# Patient Record
Sex: Female | Born: 1970 | Hispanic: No | Marital: Married | Smoking: Never smoker
Health system: Southern US, Community
[De-identification: ages and names within clinical notes are randomized; demographics above are authoritative.]

## PROBLEM LIST (undated history)

## (undated) ENCOUNTER — Emergency Department (HOSPITAL_COMMUNITY): Discharge status: Absent without leave | Payer: Self-pay

## (undated) DIAGNOSIS — R51 Headache: Secondary | ICD-10-CM

## (undated) HISTORY — PX: OTHER SURGICAL HISTORY: SHX169

---

## 2001-01-08 ENCOUNTER — Other Ambulatory Visit: Admission: RE | Admit: 2001-01-08 | Discharge: 2001-01-08 | Payer: Self-pay | Admitting: Gynecology

## 2001-07-04 ENCOUNTER — Encounter: Admission: RE | Admit: 2001-07-04 | Discharge: 2001-07-04 | Payer: Self-pay | Admitting: Gynecology

## 2001-07-04 ENCOUNTER — Encounter: Payer: Self-pay | Admitting: Gynecology

## 2001-12-31 ENCOUNTER — Other Ambulatory Visit: Admission: RE | Admit: 2001-12-31 | Discharge: 2001-12-31 | Payer: Self-pay | Admitting: Gynecology

## 2002-12-16 ENCOUNTER — Other Ambulatory Visit: Admission: RE | Admit: 2002-12-16 | Discharge: 2002-12-16 | Payer: Self-pay | Admitting: Gynecology

## 2004-01-26 ENCOUNTER — Other Ambulatory Visit: Admission: RE | Admit: 2004-01-26 | Discharge: 2004-01-26 | Payer: Self-pay | Admitting: Gynecology

## 2004-01-28 ENCOUNTER — Encounter: Admission: RE | Admit: 2004-01-28 | Discharge: 2004-01-28 | Payer: Self-pay | Admitting: Gynecology

## 2004-07-06 ENCOUNTER — Ambulatory Visit (HOSPITAL_COMMUNITY): Admission: RE | Admit: 2004-07-06 | Discharge: 2004-07-06 | Payer: Self-pay | Admitting: Urology

## 2004-07-06 ENCOUNTER — Encounter (INDEPENDENT_AMBULATORY_CARE_PROVIDER_SITE_OTHER): Payer: Self-pay | Admitting: *Deleted

## 2005-01-31 ENCOUNTER — Other Ambulatory Visit: Admission: RE | Admit: 2005-01-31 | Discharge: 2005-01-31 | Payer: Self-pay | Admitting: Gynecology

## 2005-02-03 ENCOUNTER — Encounter: Admission: RE | Admit: 2005-02-03 | Discharge: 2005-02-03 | Payer: Self-pay | Admitting: Gynecology

## 2006-09-15 ENCOUNTER — Ambulatory Visit: Payer: Self-pay | Admitting: Internal Medicine

## 2006-10-17 ENCOUNTER — Inpatient Hospital Stay (HOSPITAL_COMMUNITY): Admission: AD | Admit: 2006-10-17 | Discharge: 2006-11-06 | Payer: Self-pay | Admitting: *Deleted

## 2006-10-17 IMAGING — US US OB TRANSVAGINAL MODIFY
1 series · 13 of 28 positions shown · non-contrast
Comparison: none

CLINICAL DATA: 32 week 4 day assigned gestational age by prior ultrasound.  Follow-up placenta previa, growth, and amniotic fluid.

[Series 1: us ob transvaginal modify · 0.25mm/px · 13 of 37 slices shown]
[im 2/37]
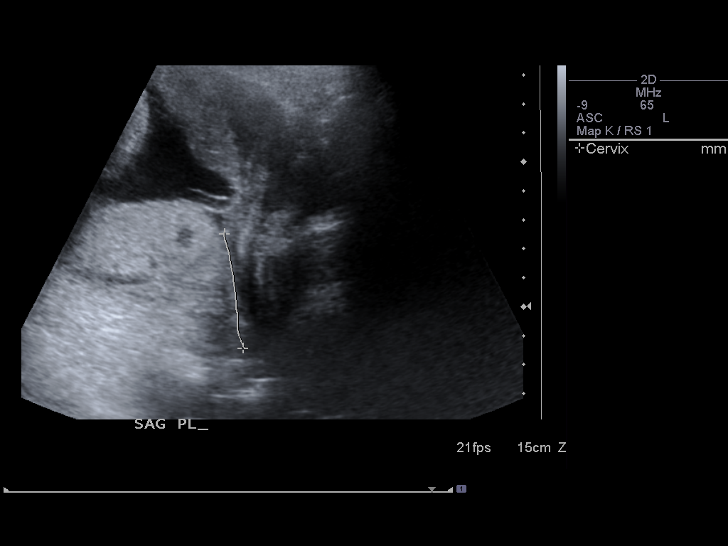
[im 5/37]
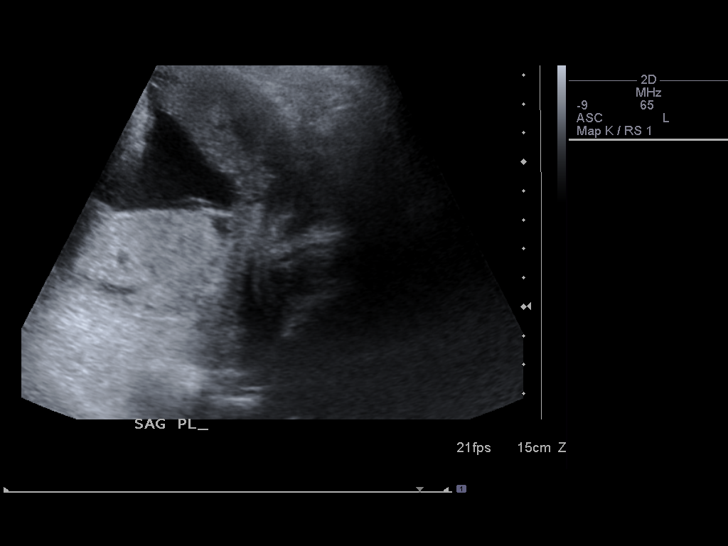
[im 7/37]
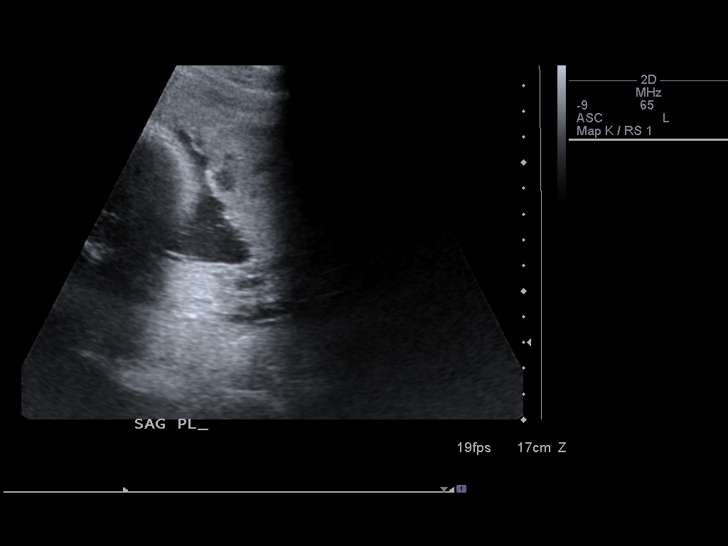
[im 10/37]
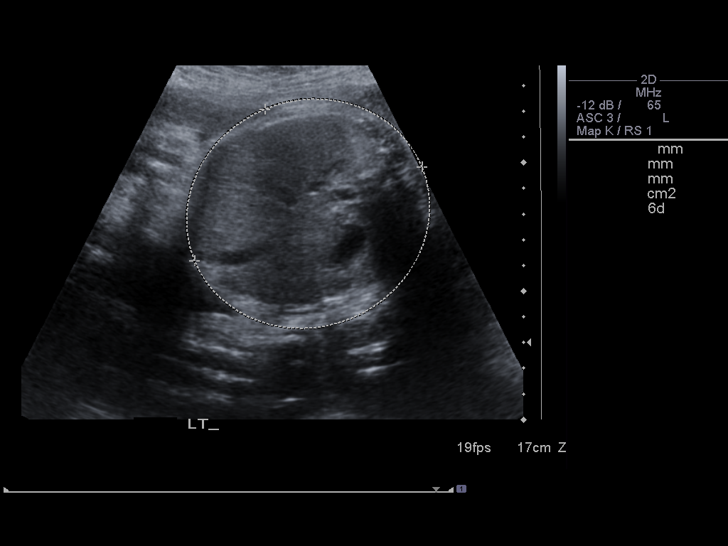
[im 13/37]
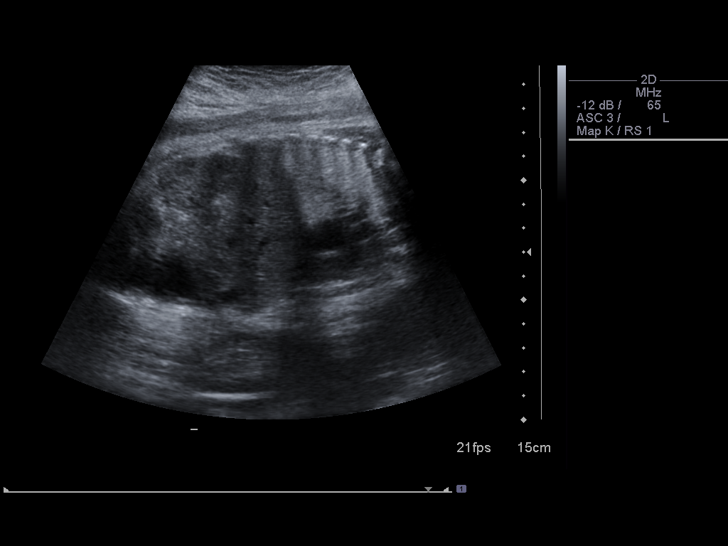
[im 15/37]
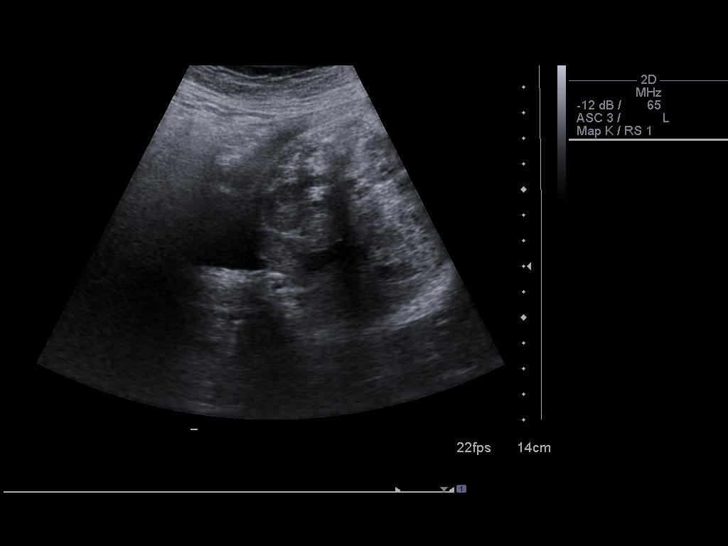
[im 19/37]
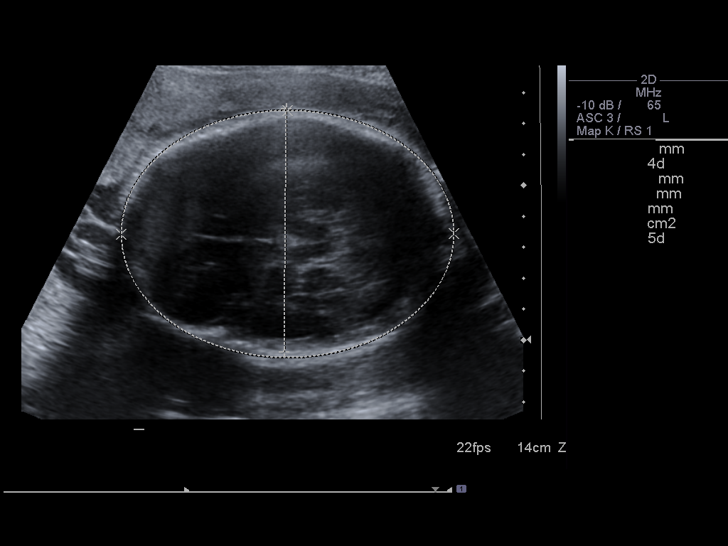
[im 22/37]
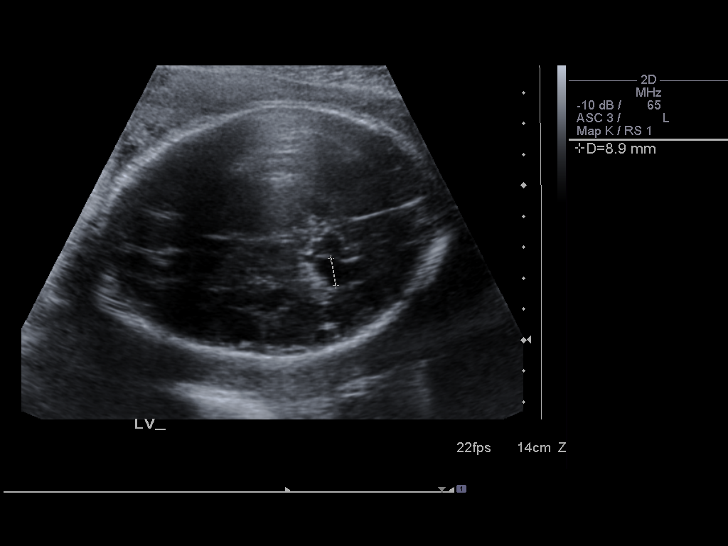
[im 25/37]
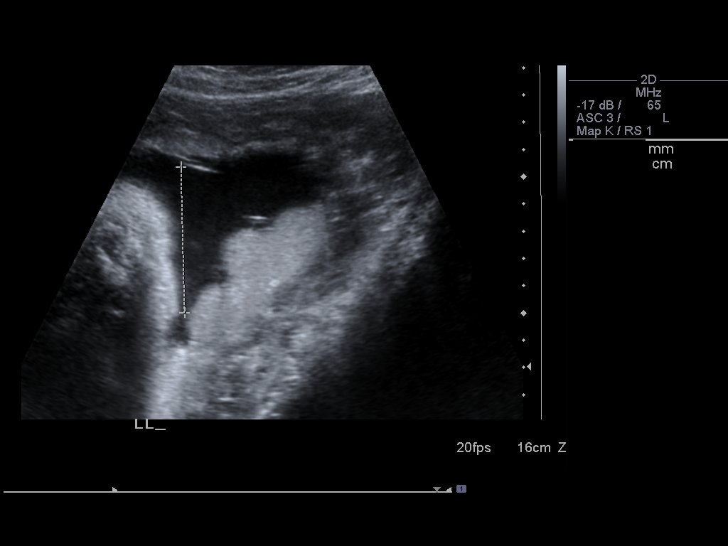
[im 27/37]
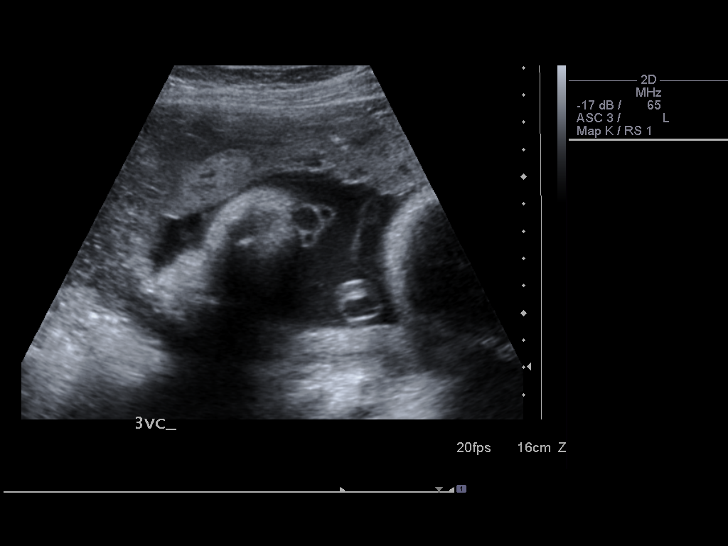
[im 30/37]
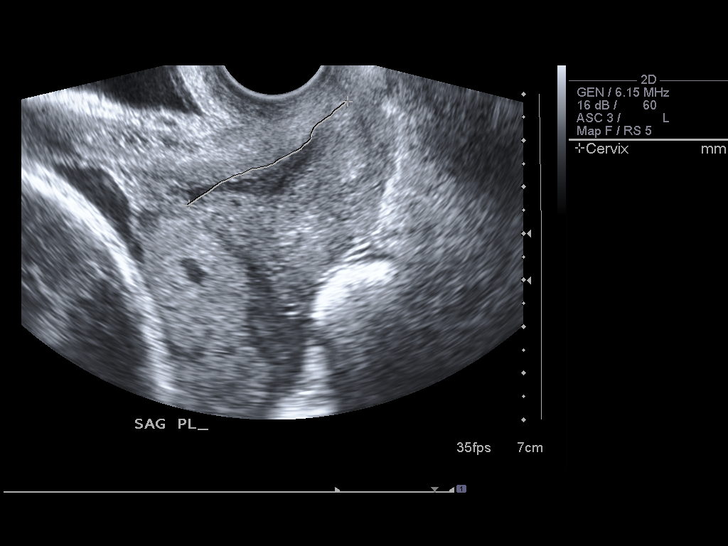
[im 33/37]
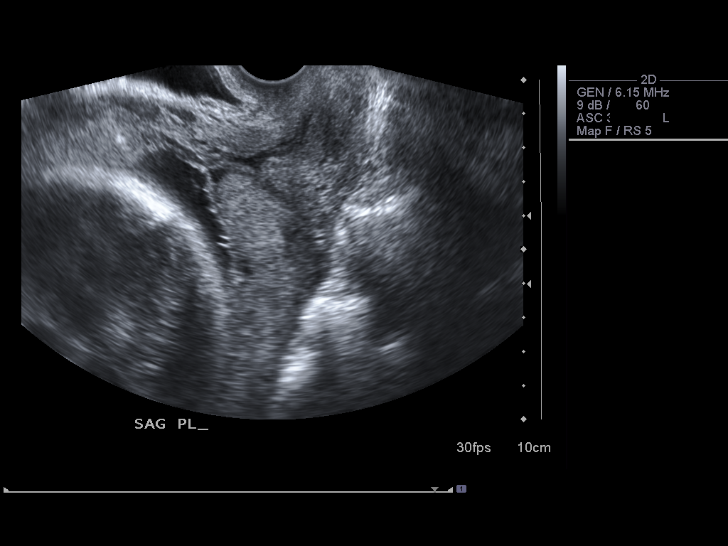
[im 35/37]
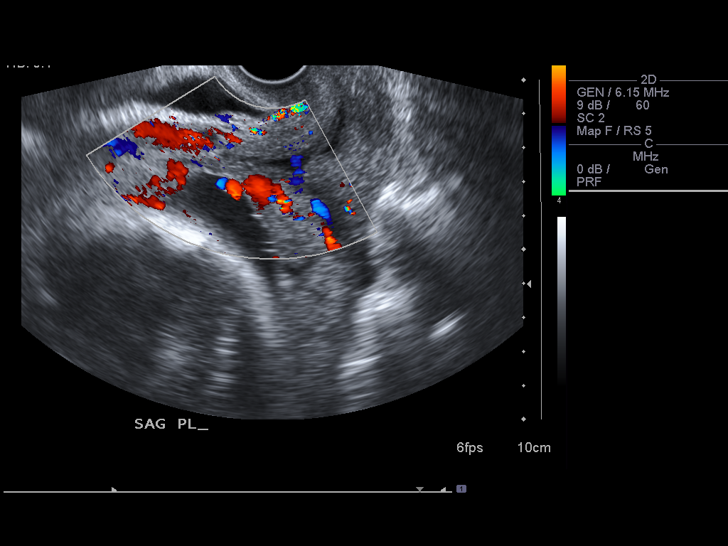

[13 of 28 positions shown; findings below may reference images not displayed]

OBSTETRICAL ULTRASOUND AND TRANSVAGINAL OB US:
 Number of Fetuses:  1
 Heart Rate:  132 bpm
 Movement:  Yes
 Breathing:  Yes
 Presentation:  Cephalic
 Placental Location:  Posterior
 Grade:  II
 Previa:  Asymmetric complete
 Amniotic Fluid (Subjective):  Normal
 Amniotic Fluid (Objective):  AFI 18.0 cm (2th-72th %ile = 8.3 to 24.5 cm for 33 weeks) 

 FETAL BIOMETRY
 BPD:  7.9 cm   31 w 4 d 
 HC:  29.7 cm   32 w 6 d 
 AC:  28.9 cm   33 w 0 d 
 FL:  6.4 cm   32 w 6 d 

 MEAN GA:   32 w 4 d   US EDC:  12/08/06   
 Assigned GA:  32 w 4 d   Assigned EDC:  12/08/06

 EFW:  1322 grams 50th ? 75th %ile (2020 ? 9982 g) for 33 weeks 

 FETAL ANATOMY
 Lateral Ventricles:  Visualized 
 Thalami/CSP:  Visualized 
 Posterior Fossa:  Visualized 
 Nuchal Region:  Not visualized 
 Spine:  Not visualized 
 4 Chamber Heart on Left:  Not visualized 
 Stomach on Left:  Visualized 
 3 Vessel Cord:  Visualized 
 Cord Insertion site:  Not visualized 
 Kidneys:  Visualized 
 Bladder:  Visualized 
 Extremities:  Not visualized 

 Evaluation limited by:  Advanced gestational age.  

 MATERNAL UTERINE AND ADNEXAL FINDINGS
 Cervix:  4.1 cm transvaginal.
IMPRESSION: 1.  Assigned gestational age by prior ultrasound is currently 32 weeks 4 days.  Appropriate fetal growth with EFW slightly greater than 50th percentile.  
 2.  Asymmetric complete placenta previa.  No abruption identified.  Normal cervical length.
 3.  Normal amniotic fluid volume.

## 2006-10-18 ENCOUNTER — Ambulatory Visit: Payer: Self-pay | Admitting: Neonatology

## 2006-11-02 IMAGING — US US AMNIOCENTESIS
1 series · 1 of 1 positions shown · non-contrast
Comparison: none

CLINICAL DATA: 32 weeks with placenta previa.  Evaluate for lung maturity.  
 ULTRASOUND-GUIDED AMNIOCENTESIS:
 Ultrasound was utilized to perform amniocentesis by the requesting physician.

[Series 1: us amniocentesis · 1 of 1 slices shown]
[im 1/1]
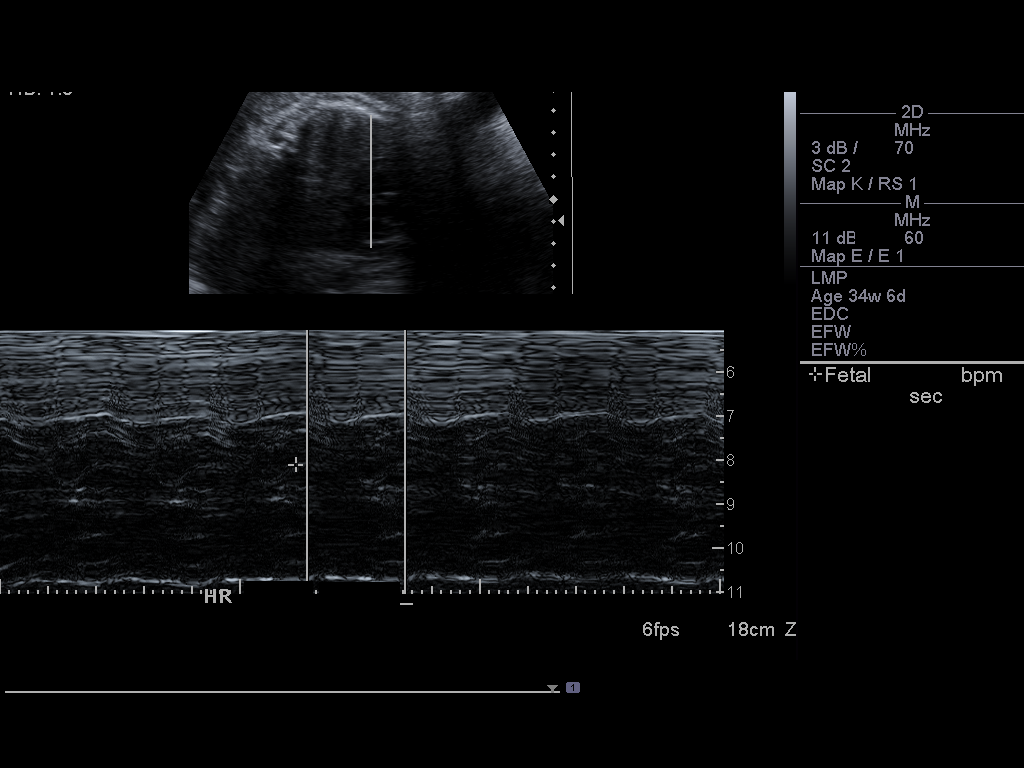

[1 of 1 positions shown; findings below may reference images not displayed]

IMPRESSION: No radiographic interpretation.

## 2006-11-03 ENCOUNTER — Encounter (INDEPENDENT_AMBULATORY_CARE_PROVIDER_SITE_OTHER): Payer: Self-pay | Admitting: *Deleted

## 2008-10-10 HISTORY — PX: LAPAROSCOPIC SUPRACERVICAL HYSTERECTOMY: SUR797

## 2008-11-25 ENCOUNTER — Inpatient Hospital Stay (HOSPITAL_COMMUNITY): Admission: AD | Admit: 2008-11-25 | Discharge: 2008-11-29 | Payer: Self-pay | Admitting: Obstetrics and Gynecology

## 2008-12-04 ENCOUNTER — Inpatient Hospital Stay (HOSPITAL_COMMUNITY): Admission: AD | Admit: 2008-12-04 | Discharge: 2008-12-04 | Payer: Self-pay | Admitting: Obstetrics

## 2008-12-04 ENCOUNTER — Encounter: Payer: Self-pay | Admitting: Obstetrics and Gynecology

## 2008-12-04 IMAGING — US US OB LIMITED
1 series · 14 of 28 positions shown · non-contrast
Comparison: none

OBSTETRICAL ULTRASOUND:
 This ultrasound was performed in The [HOSPITAL], and the [facility] report will be stored to [REDACTED] PACS.

[Series 1: us ob limited · 14 of 44 slices shown]
[im 2/44]
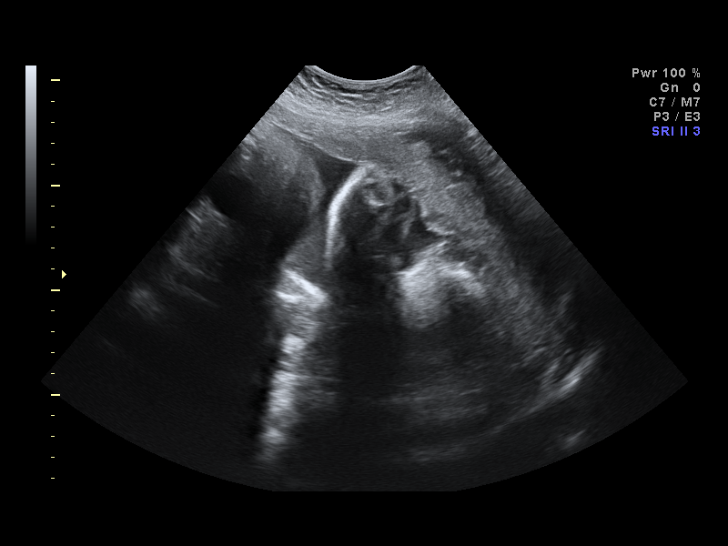
[im 5/44]
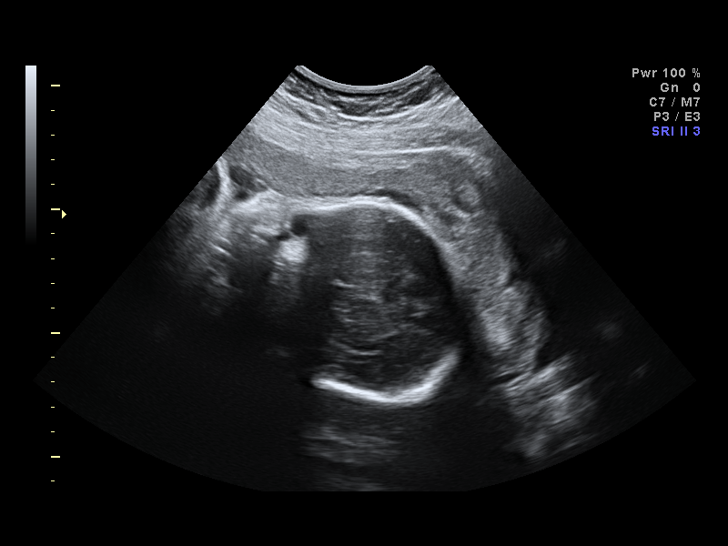
[im 8/44]
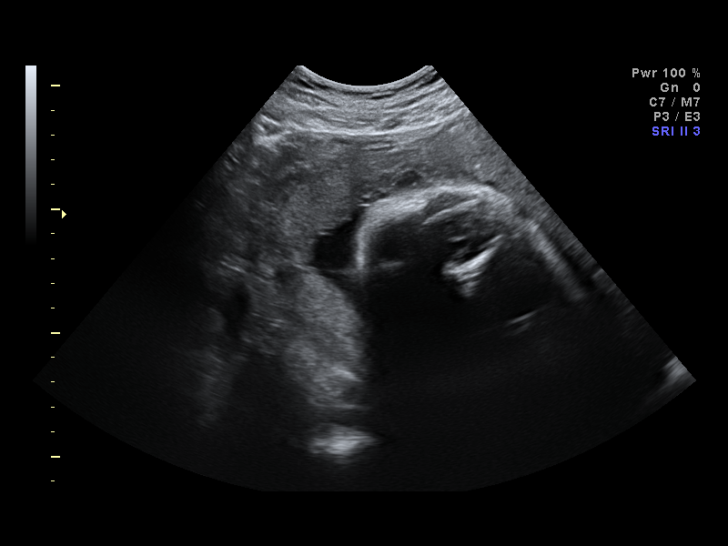
[im 12/44]
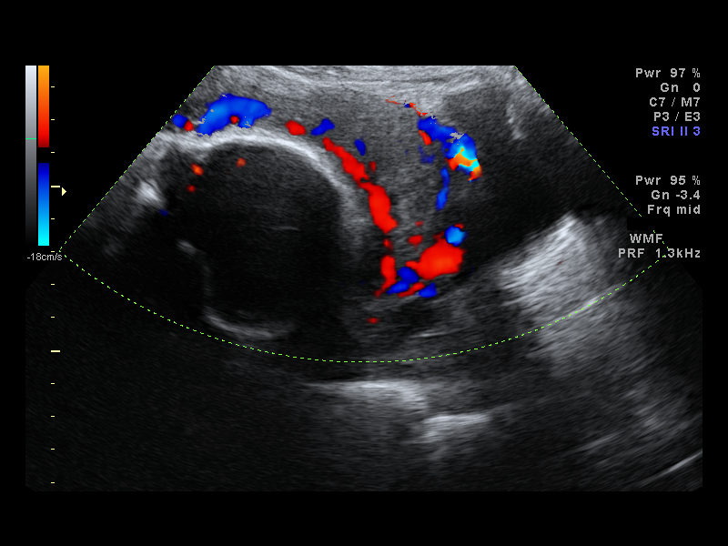
[im 15/44]
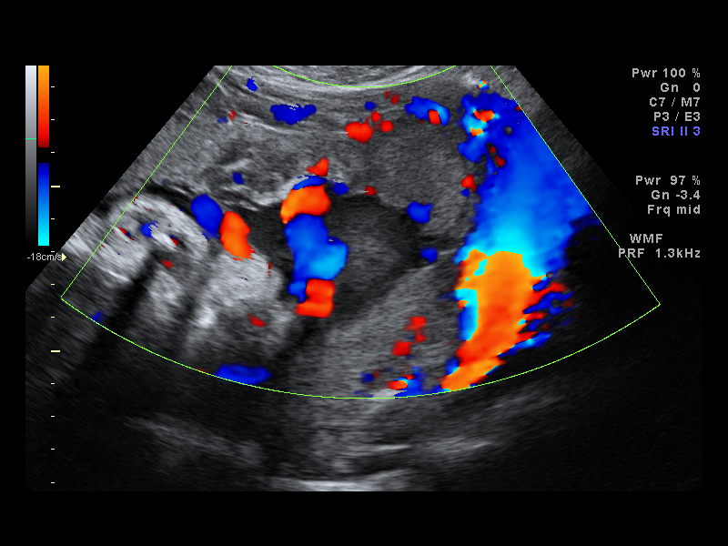
[im 18/44]
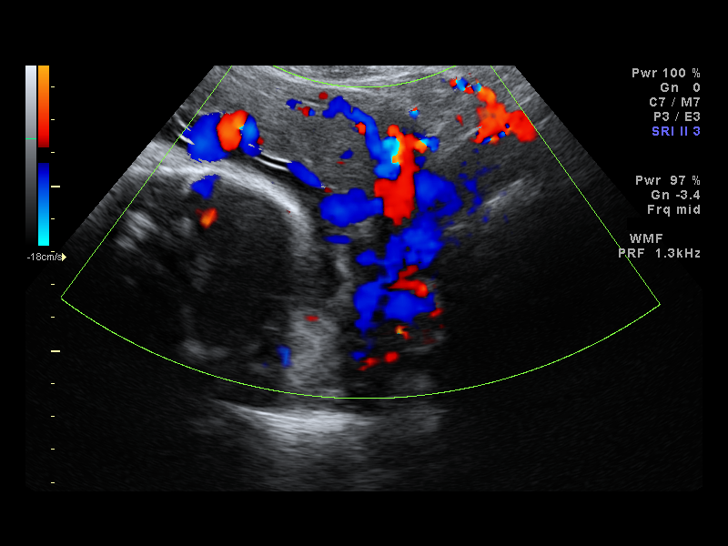
[im 21/44]
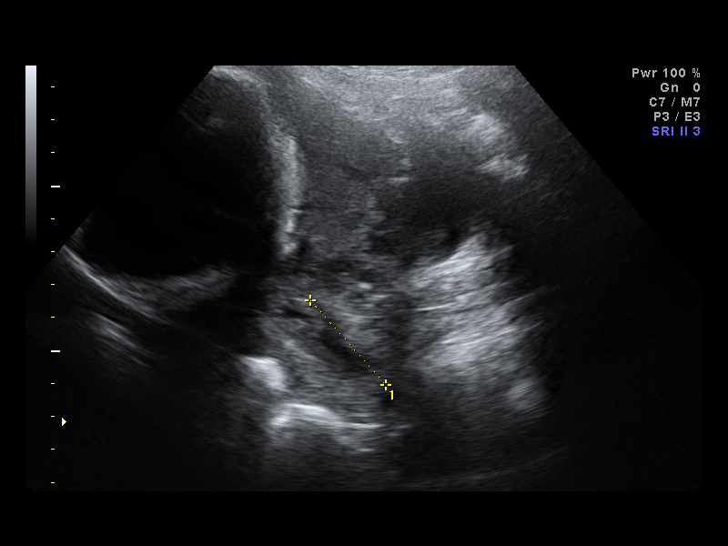
[im 24/44]
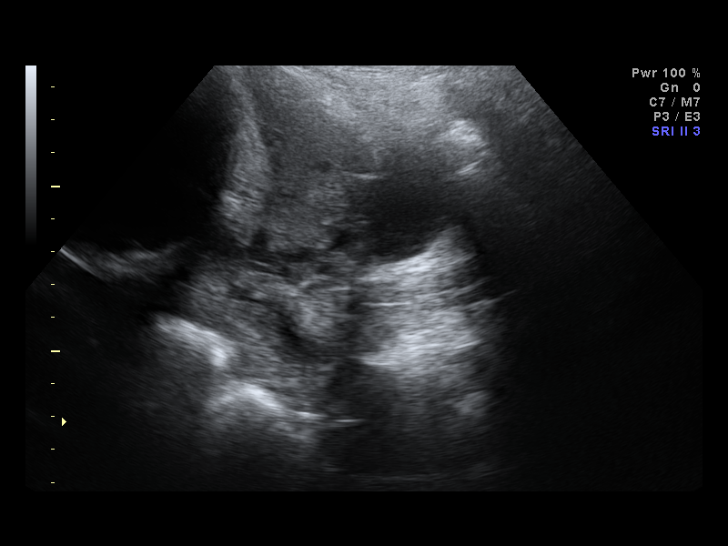
[im 28/44]
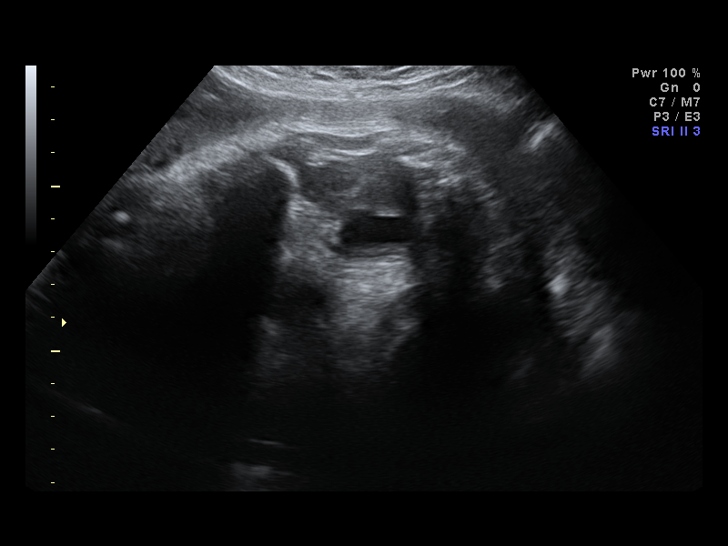
[im 31/44]
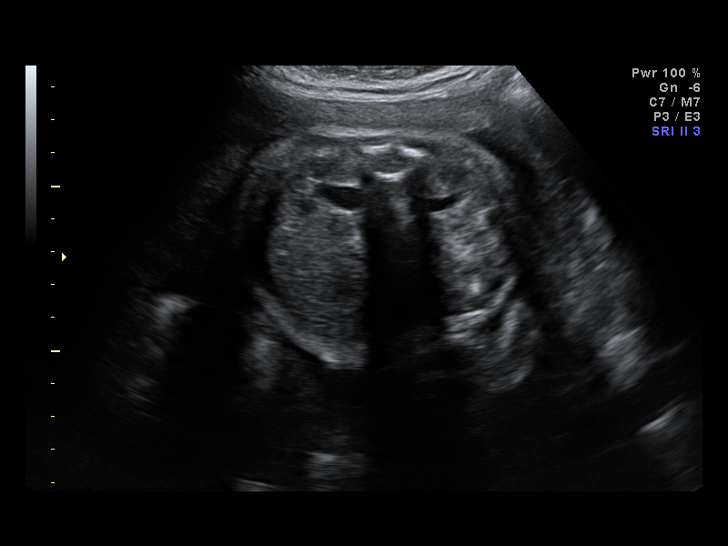
[im 34/44]
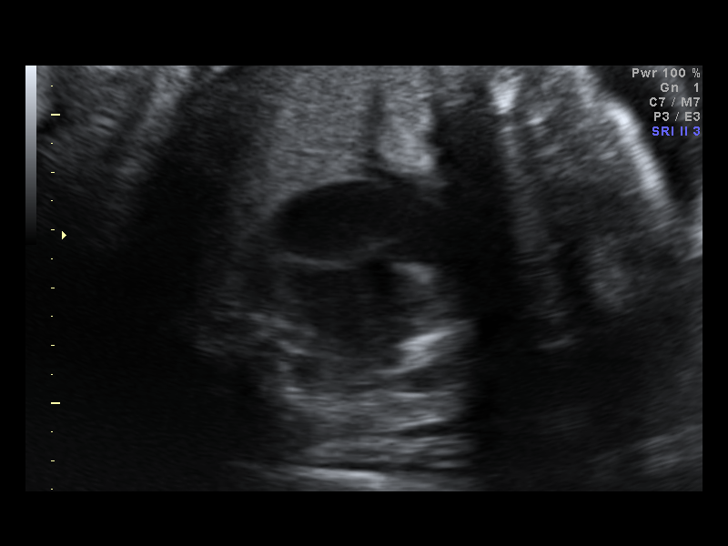
[im 37/44]
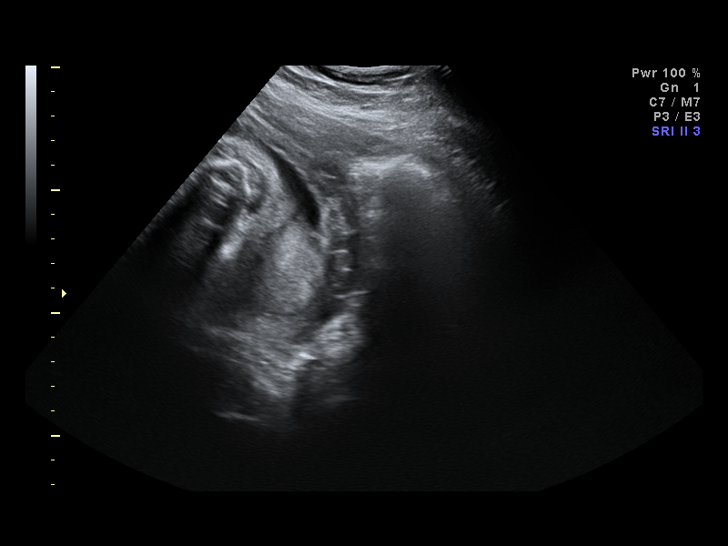
[im 40/44]
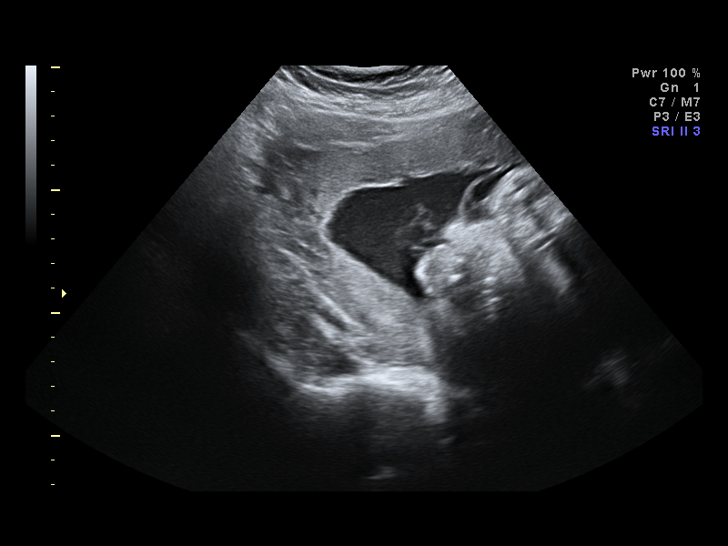
[im 44/44]
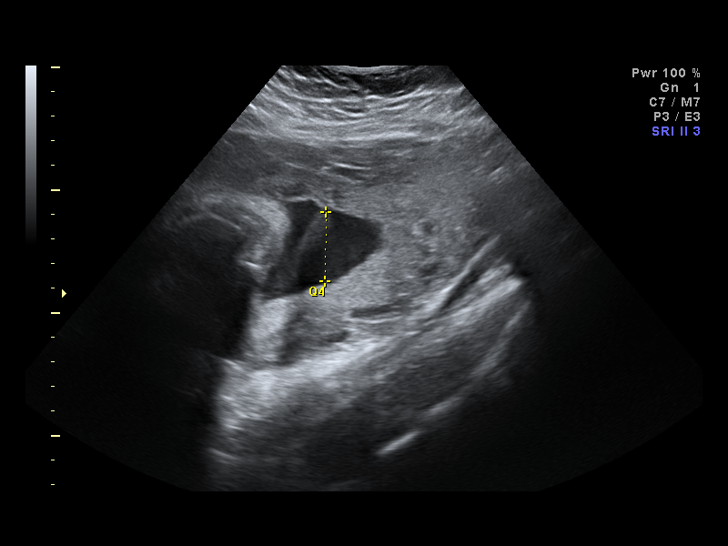

[14 of 28 positions shown; findings below may reference images not displayed]

IMPRESSION: [facility] has also been faxed to the ordering physician.

## 2010-10-31 ENCOUNTER — Encounter: Payer: Self-pay | Admitting: Family Medicine

## 2011-01-25 LAB — TYPE AND SCREEN
ABO/RH(D): O POS
ABO/RH(D): O POS
Antibody Screen: NEGATIVE
Antibody Screen: NEGATIVE

## 2011-01-25 LAB — CBC
HCT: 33.6 % — ABNORMAL LOW (ref 36.0–46.0)
Hemoglobin: 11.4 g/dL — ABNORMAL LOW (ref 12.0–15.0)
MCHC: 33.9 g/dL (ref 30.0–36.0)
Platelets: 282 10*3/uL (ref 150–400)
RBC: 3.7 MIL/uL — ABNORMAL LOW (ref 3.87–5.11)
RBC: 3.79 MIL/uL — ABNORMAL LOW (ref 3.87–5.11)
RDW: 14.1 % (ref 11.5–15.5)
WBC: 9.2 10*3/uL (ref 4.0–10.5)

## 2011-01-25 LAB — SAMPLE TO BLOOD BANK

## 2011-02-04 ENCOUNTER — Other Ambulatory Visit: Payer: Self-pay | Admitting: Obstetrics and Gynecology

## 2011-02-04 DIAGNOSIS — Z1231 Encounter for screening mammogram for malignant neoplasm of breast: Secondary | ICD-10-CM

## 2011-02-09 ENCOUNTER — Ambulatory Visit
Admission: RE | Admit: 2011-02-09 | Discharge: 2011-02-09 | Disposition: A | Discharge status: Home / Self Care | Payer: Federal, State, Local not specified - PPO | Source: Ambulatory Visit | Attending: Obstetrics and Gynecology | Admitting: Obstetrics and Gynecology

## 2011-02-09 DIAGNOSIS — Z1231 Encounter for screening mammogram for malignant neoplasm of breast: Secondary | ICD-10-CM

## 2011-02-22 NOTE — H&P (Signed)
NAMECHERRILL, Stacey            ACCOUNT NO.:  [id number]   MEDICAL RECORD NO.:  [id number]          PATIENT TYPE:  INP   LOCATION:  9152                          FACILITY:  [facility]   PHYSICIAN:  Lenoard Aden, M.D.DATE OF BIRTH:  [birth year]   DATE OF ADMISSION:  11/25/2008  DATE OF DISCHARGE:                              HISTORY & PHYSICAL   CHIEF COMPLAINT:  Placenta previa with bleeding.   She is a 40 year old female G6, P1 at 62 and 6/7th weeks' gestation who  presented to the office with 1 episode of spotting today.  After  discharge, she had an episode of heavy bleeding tonight which saturated  1 pad.  She presents to the emergency room for further evaluation.  Upon  presentation, minimal active bleeding is noted.   She has no known drug allergies.   MEDICATIONS:  Valtrex as needed, Nasonex, albuterol, Intal, and prenatal  vitamins.   FAMILY HISTORY:  She has a family history of kidney stones, diabetes,  brain cancer, and osteoporosis.   She has a previous history of 3 therapeutic abortions and 1 spontaneous  abortion and a previous C-section, planned for placenta previa with  bleeding.   SOCIAL HISTORY:  Noncontributory.   PHYSICAL EXAMINATION:  GENERAL:  She is a well-developed, well-nourished  white female in no acute distress.  HEENT:  Normal.  NECK:  Supple.  Full range of motion.  LUNGS:  Clear.  HEART:  Regular rhythm.  ABDOMEN:  Soft, gravid, and nontender.  Lower abdominal incision noted.  Speculum exam reveals no active bleeding  EXTREMITIES:  There are no cords.  NEUROLOGIC:  Nonfocal.  SKIN:  Intact.   NST is reactive.  No contractions are noted.   IMPRESSION:  1. A 28 and 6/7th week intrauterine pregnancy.  2. Complete placenta previa with subsequent episode of bleeding for      inpatient management.   PLAN:  IV fluids, betamethasone, NICU consult.  Continuous monitoring  clear liquid at this time.  Anticipate inpatient hospitalization at  this  time.      Lenoard Aden, M.D.  Electronically Signed     RJT/MEDQ  D:  11/25/2008  T:  11/26/2008  Job:  [id number]

## 2011-02-22 NOTE — H&P (Signed)
Stacey Esparza, Stacey Esparza            ACCOUNT NO.:  [id number]   MEDICAL RECORD NO.:  [id number]          PATIENT TYPE:  OUT   LOCATION:  MFM                           [facility]   PHYSICIAN:  Lenoard Aden, M.D.DATE OF BIRTH:  Oct 27, 1970   DATE OF ADMISSION:  12/04/2008  DATE OF DISCHARGE:                              HISTORY & PHYSICAL   TRANSFER SUMMARY   CHIEF COMPLAINT:  Bleeding.   HISTORY OF PRESENT ILLNESS:  The patient is a 40 year old female G6, P0-  1-4-1 with a history of previous C-section at [redacted] weeks gestation for a  complete previa in 2008 and now subsequently pregnant with recurrent  placenta previa.  She most recently on Monday, February 22 had an MRI  performed at [facility] which reveals a suspicion for a left lateral  accreta and possible percreta in conjunction with her placenta previa.  Consultation was done with Dr. Roney Mans at that time who agrees  that the patient will need to be transferred for delivery for  institutional and surgical support to [facility].  The patient has had  steroids as of 1 week ago and betamethasone is complete.  The rest of  her prenatal care has been uncomplicated to date.   ALLERGIES:  The patient has no known drug allergies.   PAST MEDICAL HISTORY:  She has a past medical history remarkable for  asthma, urolithiasis, fibroids, HSV and asthma.   SOCIAL HISTORY:  She is a nonsmoker, nondrinker.  She denies domestic or  physical violence.   PREGNANCY HISTORY:  She has a pregnancy history remarkable for an  uncomplicated C-section at 35 weeks for a complete previa, history of  TAB x3 and D and C x1 previously.   MEDICATIONS:  To include Nasonex as needed, Intal inhaler as needed,  prenatal vitamins and Valtrex as needed.   PHYSICAL EXAM:  GENERAL:  She is a well-developed, well-nourished female  in no acute distress.  VITAL SIGNS:  Stable.  The patient is afebrile.  HEENT:  Normal.  LUNGS:  Clear.  HEART:   Regular rhythm.  NECK:  Supple.  Full range of motion.  ABDOMEN:  Soft, gravid, nontender.  Estimated fetal weight has been in  the normal range per previous serial sonography.  There is no CVA  tenderness.  EXTREMITIES:  There are no cords.  NEUROLOGICAL:  Nonfocal.  SKIN:  Intact.  PELVIC:  Exam deferred.  Pad visualization reveals minimal spotting and  no heavy bleeding during this most recent hospitalization.   CBC today reveals a white blood cell count of 9.4, hemoglobin 11.4,  hematocrit of 33.6 and platelet count 285,000.   IMPRESSION:  1. A 30 week OB.  2. Previous C-section.  3. Complete previa with MRI suspicious for accreta/percreta as      discussed with the radiologist.   PLAN:  Will be continuous monitoring at this time, repeat consultation  will be obtained this morning with maternal fetal medicine, Dr. Rachel Bo.  Will recommend at that time due to the patient's stable status and  recurrent spotting that she be transferred at this time to  [facility]  for institutional and surgical support with regards to her delivery.  The patient does not live within 20 minutes of the [facility] facility  therefore inpatient hospitalization will most likely be required.  Discussion has been made with the patient for the need for  interventional radiology with probable and likely cesarean hysterectomy  and midline incision.  She acknowledges these risks and is comfortable  with proceeding.      Lenoard Aden, M.D.  Electronically Signed     RJT/MEDQ  D:  12/04/2008  T:  12/04/2008  Job:  [id number]

## 2011-02-25 NOTE — Op Note (Signed)
Stacey Esparza, Stacey Esparza            ACCOUNT NO.:  [id number]   MEDICAL RECORD NO.:  [id number]          PATIENT TYPE:  INP   LOCATION:  9154                          [facility]ILITY:  [facility]   PHYSICIAN:  Lenoard Aden, M.D.DATE OF BIRTH:  [birth year]   DATE OF PROCEDURE:  10/24/2006  DATE OF DISCHARGE:                               OPERATIVE REPORT   PREOPERATIVE DIAGNOSIS:  Thrombosed hemorrhoid.   POSTOPERATIVE DIAGNOSIS:  Thrombosed hemorrhoid.   PROCEDURE:  Incision and drainage of thrombosed hemorrhoid.   SURGEON:  Lenoard Aden, M.D.   ANESTHESIA:  Local.   ESTIMATED BLOOD LOSS:  Less than 10 mL.   COMPLICATIONS:  None.   DRAINS:  None.   DISPOSITION:  Patient to Recovery in good condition.   DESCRIPTION:  After being apprised of risks and benefits of infection  and bleeding and consent is signed, a 2- to 3-cm anterior thrombosed  hemorrhoid is noted.  This area was cleansed with a Betadine solution  and topically anesthetized using Hurricaine spray.  A local solution on  a dilute Xylocaine and epinephrine solution is placed at the base of the  hemorrhoid and topically over the area of thrombosis.  A #11 scalpel is  then used to make a small linear incision in the hemorrhoid and a large  amount of blood clot is drained.  Hemostasis is achieved using silver  nitrate; good hemostasis is noted.  The patient tolerates the procedure  well and is recovering in good condition.      Lenoard Aden, M.D.  Electronically Signed     RJT/MEDQ  D:  10/24/2006  T:  10/24/2006  Job:  [id number]   cc:   [facility]

## 2011-02-25 NOTE — Discharge Summary (Signed)
Stacey Esparza, Stacey Esparza            ACCOUNT NO.:  [id number]   MEDICAL RECORD NO.:  [id number]          PATIENT TYPE:  INP   LOCATION:  9152                          [facility]   PHYSICIAN:  Lenoard Aden, M.D.DATE OF BIRTH:  [birth year]   DATE OF ADMISSION:  11/25/2008  DATE OF DISCHARGE:  11/29/2008                               DISCHARGE SUMMARY   The patient was admitted on November 25, 2008, 28 and 6/7th weeks.  She  was admitted with bleeding and hospital course was uncomplicated.  She  remained stable.  She did not believe during the course of her hospital  management, fetal heart rate surveillance was reassuring with reactive  NSTs performed twice daily.  Maternal fetal consultation was done.  MRI  was scheduled.  She is discharged to home on hospital day #4.  Bleeding  precautions were given.  Pelvic rest.  Medications include prenatal  vitamins and iron.  Follow up in the office within 1 week.      Lenoard Aden, M.D.  Electronically Signed     RJT/MEDQ  D:  02/12/2009  T:  02/13/2009  Job:  [id number]

## 2011-02-25 NOTE — Assessment & Plan Note (Signed)
**Note De-Identified Stacey Obfuscation** [facility]                             PULMONARY OFFICE NOTE   NAME:Stacey Esparza, Stacey Esparza                   MRN:          [id number]  DATE:09/15/2006                            DOB:          [birth year]    PROBLEM:  Pulmonary consultation at the kind request of Dr. Earlene Plater, at  [facility], for this 40 year old woman in her first pregnancy  concerned about asthma.   HISTORY:  Asthma was first diagnosed 20 years ago. Usual triggers have  included exposure to animals and house dust. An albuterol inhaler sample  has been helping, but she has been trying to be sparing with that, using  it only once-Esparza-day, unsure about guidelines. Her pregnancy is  uncomplicated. Usual season peak for her respiratory complaints is  winter. Currently, she feels stable. Before pregnancy, one inhaler was  lasting all year. Weather changes seem to make some difference. Spring  and fall pollen exposures are less clearly aggravating and there is no  history of trouble with aspirin. Persistent cough and shortness of  breath are beginning to wake her.   MEDICATIONS:  Zyrtec, pre-natal vitamins, Pro-Air albuterol inhaler.   No medication allergy. No problems with aspirin, contrast, iodine  reactions or latex.   REVIEW OF SYSTEMS:  Shortness of breath mainly with activity, non-  productive cough, some chest tightness and wheeze, and occasional post-  nasal drainage. She denies reflux symptoms, chest pain, palpitation,  rash or ankle edema. There has been nothing bloody or purulent.   PAST HISTORY:  Seasonal rhinitis, sinusitis, no otitis, and occasional  bronchitis. Surgery limited to Esparza breast cyst in 2005. No ENT surgery.   SOCIAL HISTORY:  Never smoked, married, with no children yet. She works  as Esparza Education officer, environmental for [employer].  She and her husband live in Esparza house with central air conditioning, no  basement, no recognized mold problems,  and 2 dogs. She does not think  any of her bedding is feather, but they do not have encasings or air  cleaners.   FAMILY HISTORY:  Esparza sister has asthma. Esparza sister has obstructive sleep  apnea with CPAP. Grandmother has cancer.   OBJECTIVE:  Weight 178 pounds, blood pressure 108/64, pulse regular 105,  and room air saturation 96%.  She looks quite comfortable now.  SKIN: Old acne, no rash.  ADENOPATHY: None found.  HEENT: Palate spacing is 3/4, there is mucoid rhinitis with turbinate  edema, no visible polyps, pharynx is not red, there is no strider, voice  quality is normal, no neck vein distension or thyromegaly.  CHEST: Dry cough, trace wheeze, unlabored, no dullness.  HEART: Regular rhythm, no murmur or gallop.  EXTREMITIES: No cyanosis, clubbing, or edema.   IMPRESSION:  1. Chronic asthma with viral and environmental and allergic triggers      most likely. Currently, beginning to have more trouble, partly      because of mechanical bulk effects of her pregnancy. I have      educated her about watching for reflux as Esparza potential trigger, but      she does not  think she is having that yet.  2. Near the end of the exam, she does mention that snoring is an      issue, and we discussed the symptoms that would suggest sleep      apnea. This can be addressed after she delivers if it persists. She      will try to sleep off the flat of her back, and we will treat her      rhinitis symptoms to see if that helps as well.   PLAN:  1. Add Intal 2 puffs q.i.d. on Esparza maintenance basis as an anti-      inflammatory.  2. Prescription to albuterol HFA rescue inhaler 2 puffs q.i.d. p.r.n.  3. Sample Nasonex one or two sprays each nostril daily.  4. We discussed asthma in pregnancy, including the basic understanding      that she must breathe for her baby, and that may need to take      precedence over any concerns about choice of medication, but we      will try to minimize use of medications  with Esparza potential fetal      involvement where we can. She was comfortable with this approach.      Schedule to return in 3 weeks or earlier p.r.n.     Clinton D. Maple Hudson, MD, Tonny Bollman, FACP  Electronically Signed    CDY/MedQ  DD: 09/16/2006  DT: 09/17/2006  Job #: [id number]   cc:   Gerri Spore B. Earlene Plater, M.D.

## 2011-02-25 NOTE — Discharge Summary (Signed)
Stacey Esparza, Stacey Esparza            ACCOUNT NO.:  [id number]   MEDICAL RECORD NO.:  [id number]          PATIENT TYPE:  INP   LOCATION:  9131                          [facility]   PHYSICIAN:  [place] B. Earlene Plater, M.D.  DATE OF BIRTH:  June 22, 1971   DATE OF ADMISSION:  10/17/2006  DATE OF DISCHARGE:  11/06/2006                               DISCHARGE SUMMARY   ADMISSION DIAGNOSES:  1. 32-week intrauterine pregnancy.  2. Placenta previa.   DISCHARGE DIAGNOSES:  1. 32-week intrauterine pregnancy.  2. Placenta previa.   HISTORY OF PRESENT ILLNESS:  This is a 40 year old African American  female gravida 5, para 0, A-4, who was admitted at 32-4/7 weeks with  known placenta previa with one episode of bleeding back at 29 weeks.  The patient had traveled for the holidays, had an episode of bleeding  and was temporarily hospitalized in [place] as a result.  When the  patient was deemed stable she was discharged to home and returned to the  office for follow up.  It was my recommendation at that time to go ahead  and be readmitted given her placenta previa and history of bleeding.  However, the patient preferred to stay at home which she did for about  two weeks.  At that point, she agreed for inpatient admission.   HOSPITAL COURSE:  The patient was admitted to the antepartum service  ultimately at 32-4/7 weeks, placed on bed rest with intermediate fetal  monitoring.  This was always reassuring.  Ultrasound showed appropriate  interval growth and normal fluid.  The patient was administered Beta  Methasone during her initially bleeding episode at 29 to 30 weeks.  On  admission, consultation with maternal fetal medicine was obtained and  the recommendation was for continued inpatient with amniocentesis at 35  weeks and if lungs were documented to be mature move ahead with  delivery.   The patient subsequently underwent amniocentesis at 34-5/7 weeks and was  found to have mature lung  studies.  Subsequently, the patient was  delivered by a primary low transverse cesarean section.  Findings at the  time of surgery included a viable female, Apgar's 6 and 8, weight 5  pounds 6 ounces, complete placenta previa and a posterior fundal  subserosal fibroid which was about 3 cm in diameter.  Tubes and ovaries  appeared normal.  Blood loss estimation was 1,600 mL and there was no  evidence for placenta accreta.   Postoperatively, the patient rapidly regained her ability to ambulate,  void and tolerate a regular diet.  She was mildly anemic with a  postoperative hemoglobin of 8.  She was started on iron supplements as a  result.  The patient was discharged to home on the third postoperative  day in satisfactory condition.  Discharge instructions per booklet.   DISCHARGE MEDICATIONS:  1. Ferrous sulfate 325 mg p.o. b.i.d.  2. Percocet 1-2 q. 4 h p.r.n. pain.   FOLLOWUP:  With OB/GYN in 6 weeks.   CONDITION ON DISCHARGE:  Satisfactory.      Gerri Spore B. Earlene Plater, M.D.  Electronically Signed  WBD/MEDQ  D:  11/22/2006  T:  11/22/2006  Job:  [id number]

## 2011-02-25 NOTE — Op Note (Signed)
NAMESHALAN, Stacey Esparza            ACCOUNT NO.:  [id number]   MEDICAL RECORD NO.:  [id number]          PATIENT TYPE:  INP   LOCATION:  9154                          [facility]   PHYSICIAN:  [place] B. Earlene Plater, M.D.  DATE OF BIRTH:  1971/01/15   DATE OF PROCEDURE:  11/03/2006  DATE OF DISCHARGE:                               OPERATIVE REPORT   PREOPERATIVE DIAGNOSES:  1. A 35-week intrauterine pregnancy.  2. Complete placenta previa.   POSTOPERATIVE DIAGNOSES:  1. A 35-week intrauterine pregnancy.  2. Complete placenta previa.   PROCEDURE:  Primary low transverse Cesarean section with T extension.   SURGEON:  Chester Holstein. Earlene Plater, M.D.   ASSISTANT:  Richardean Sale, M.D.   ANESTHESIA:  Spinal.   SPECIMENS:  Placenta to pathology.   BLOOD LOSS:  1600.   COMPLICATIONS:  None.   FINDINGS:  Viable female, Apgars 6 and 8, weight 5 pounds 6 ounces, pH  7.19. Complete placenta previa.  Posterior fundal subserosal fibroid.  Normal appearing tubes and ovaries.   INDICATIONS:  The patient with the above history with mature lung  studies by amniocentesis performed yesterday. The patient presents for  delivery. Risks of surgery discussed including infection, bleeding,  damage to surrounding organs and potential need for life-saving  hysterectomy. The patient's hemoglobin coming in was in the 11 range.   PROCEDURE:  The patient was taken to the operating room. Spinal  anesthesia was obtained. She was prepped and draped in standard fashion.  Foley catheter inserted into the bladder.   Pfannenstiel incision made and carried sharply to the fascia. The fascia  was divided sharply, and the underlying rectus muscles were dissected  off sharply. Posterior sheath and the peritoneum elevated and entered  sharply. Bladder blade inserted. Bladder flap created sharply.   Uterine incision made in a low-transverse fashion with a knife in the  upper portion of the lower uterine segment. On entry  to the uterine  cavity, the tip of the placenta was encountered. However, the bulk of  the previa was below the incision. Amniotomy performed. Clear fluid  noted. Vigorous bleeding encountered with the incision.   The vertex was directed to the incision. However, the vertex was  floating and even with fundal pressure would not come through the  incision. Therefore, Kiwi vacuum was attempted, placed on midline from  the mid green zone, one pull, one pop off, and therefore switched to the  Mityvac mushroom cup. One additional pull which did pop off, however,  did facilitate delivery of the vertex. The uterine incision was extended  in a T fashion on its superior edge to facilitate delivery of the  vertex. The extension length was about 2 cm. Nose and mouth suctioned  with the bulb. The remainder of the infant delivered without difficulty,  nuchal cord x2 noted, cord clamped and cut and handed off to waiting  pediatricians. One gram of Ancef given after cord clamp. PH obtained.   The uterine incision T portion was closed in 2 layers in running locked  fashion with hemostasis obtained with the first layer and second layer  in  imbricating manner. The remainder of the uterine incision was closed  in running locked fashion with 0 chromic and a second imbricating layer  placed. A single bleeder in the midline inferiorly made hemostatic with  a figure-of-eight stitch of 0 Vicryl. Tubes and ovaries appeared normal.  There was a subserosal fibroid noted at the posterior fundal area  towards the patient's left about 3 cm in diameter. Otherwise, the uterus  appeared normal.   The uterus was returned to the abdomen. Pelvis irrigated. Uterine  incision inspected. It was hemostatic as was the bladder flap and  subfascial space.   The fascial was closed with a running stitch of 0 Vicryl. Subcutaneous  tissue was irrigated and made hemostatic with a Bovie. Skin was closed  with staples.   The  patient tolerated the procedure well. There were no complications.  She was taken to the recovery room awake, alert in stable condition. All  counts were correct per the operating staff.      Gerri Spore B. Earlene Plater, M.D.  Electronically Signed     WBD/MEDQ  D:  11/03/2006  T:  11/03/2006  Job:  [id number]

## 2011-02-25 NOTE — Op Note (Signed)
NAMECHAN, ROSASCO          ACCOUNT NO.:  [id number]   MEDICAL RECORD NO.:  [id number]          PATIENT TYPE:  AMB   LOCATION:  DAY                          FACILITY:  [facility]   PHYSICIAN:  Timothy E. Earlene Plater, M.D. DATE OF BIRTH:  01-12-1971   DATE OF PROCEDURE:  07/06/2004  DATE OF DISCHARGE:                                 OPERATIVE REPORT   PREOPERATIVE DIAGNOSIS:  Mass, right breast.   POSTOPERATIVE DIAGNOSIS:  Mass, right breast.   PROCEDURE:  Excision mass, right breast.   SURGEON:  Kendrick Ranch, M.D.   ANESTHESIA:  Local standby.   Ms. Sherlon Handing is 88, otherwise healthy, uses birth control pills, gravida 2,  AB 2, who has had a persistent thickening mass effect in the right upper  outer quadrant.  It has increased in size, tenderness over the past 3 months  and after careful consideration, she wishes to have a biopsy.   She was seen, identified, and the marked in the upper outer quadrant right  upper breast.   She was taken to the operating room and placed supine.  She positioned  herself.  IV sedation was given.  The right breast was prepped and draped in  the usual fashion.  The previous skin mark was removed with alcohol, and  then 0.25% Marcaine with epinephrine was used for local anesthesia.  A  curvilinear incision made over the area, subcutaneous tissue dissected.  The  mass then was palpable to the examining finger.  It was grasped with a clamp  and sharply dissected from the surrounding breast tissue in two portions.  A  third portion of fatty tissue was also submitted.  There were no other  palpable lesions after careful search of the area.  Bleeding was controlled  with the cautery, and the wound was closed in layers with 4-0 Monocryl.  Steri-Strips applied.  Counts correct.  She tolerated it well and was  removed to recovery room in good condition.   Written and verbal instructions given her and her mother including Vicodin  #24.  She will be seen and  followed in the office.      TED/MEDQ  D:  07/06/2004  T:  07/06/2004  Job:  [id number]   cc:   Leatha Gilding. Mezer, M.D.  [address]  [place]  [place] [address]  Fax: [phone]

## 2011-04-08 ENCOUNTER — Encounter (HOSPITAL_COMMUNITY): Payer: Self-pay | Admitting: *Deleted

## 2011-04-11 ENCOUNTER — Encounter (HOSPITAL_COMMUNITY): Payer: Self-pay

## 2011-04-26 NOTE — H&P (Signed)
NAMEREALITY, DEJONGE            ACCOUNT NO.:  [id number]  MEDICAL RECORD NO.:  [id number]  LOCATION:  SDC                           FACILITY:  [facility]  PHYSICIAN:  Lenoard Aden, M.D.DATE OF BIRTH:  22-Apr-1971  DATE OF ADMISSION:  03/24/2011 DATE OF DISCHARGE:                             HISTORY & PHYSICAL   CHIEF COMPLAINT:  Stress urinary incontinence by urodynamics.  HISTORY OF PRESENT ILLNESS:  She is a 40 year old white female G6, P2, history of C section x2, status post cesarean hysterectomy for placenta accreta who presents now with known stress incontinence documented by urodynamics.  She has no known drug allergies.  MEDICATIONS:  Valtrex p.r.n., Nasonex p.r.n., albuterol p.r.n.  FAMILY HISTORY:  Osteoporosis, kidney stones, brain cancer, and diabetes.  SOCIAL HISTORY:  She is a nonsmoker, nondrinker.  She denies domestic or physical violence.  PHYSICAL EXAMINATION:  Vital Signs:  Blood pressure is 118/72, weight of 181 pounds.  HEENT: Normal.  Neck: Supple.  Full range of motion. Lungs: Clear.  Heart:  Regular rhythm.  Abdomen:  Soft, nontender. Pelvic:  No adnexal masses.  No cystocele, no rectocele.  IMPRESSION:  Stress urinary incontinence documented by urodynamics. Plan to proceed with TVT.  Risks of anesthesia, infection, bleeding, intraabdominal, need for repair was discussed, possible postoperative risks of urge incontinence noted.  Small risk of urinary tract infection and mesh erosion are discussed.  Patient acknowledges and wishes to proceed.     Lenoard Aden, M.D.     RJT/MEDQ  D:  04/26/2011  T:  04/26/2011  Job:  [id number]

## 2011-04-27 ENCOUNTER — Encounter (HOSPITAL_COMMUNITY): Payer: Self-pay | Admitting: Certified Registered Nurse Anesthetist

## 2011-04-27 ENCOUNTER — Encounter (HOSPITAL_COMMUNITY)
Admission: RE | Disposition: A | Discharge status: Home / Self Care | Payer: Self-pay | Source: Ambulatory Visit | Attending: Obstetrics and Gynecology

## 2011-04-27 ENCOUNTER — Ambulatory Visit (HOSPITAL_COMMUNITY): Payer: Federal, State, Local not specified - PPO | Admitting: Certified Registered Nurse Anesthetist

## 2011-04-27 ENCOUNTER — Ambulatory Visit (HOSPITAL_COMMUNITY)
Admission: RE | Admit: 2011-04-27 | Discharge: 2011-04-27 | Disposition: A | Discharge status: Home / Self Care | Payer: Federal, State, Local not specified - PPO | Source: Ambulatory Visit | Attending: Obstetrics and Gynecology | Admitting: Obstetrics and Gynecology

## 2011-04-27 ENCOUNTER — Encounter (HOSPITAL_COMMUNITY): Payer: Self-pay | Admitting: *Deleted

## 2011-04-27 DIAGNOSIS — N393 Stress incontinence (female) (male): Secondary | ICD-10-CM | POA: Insufficient documentation

## 2011-04-27 HISTORY — DX: Headache: R51

## 2011-04-27 HISTORY — PX: BLADDER SUSPENSION: SHX72

## 2011-04-27 HISTORY — PX: CYSTOSCOPY: SHX5120

## 2011-04-27 LAB — CBC
Hemoglobin: 13.3 g/dL (ref 12.0–15.0)
MCH: 28 pg (ref 26.0–34.0)
MCHC: 32.5 g/dL (ref 30.0–36.0)
MCV: 86.1 fL (ref 78.0–100.0)

## 2011-04-27 LAB — HCG, SERUM, QUALITATIVE: Preg, Serum: NEGATIVE

## 2011-04-27 SURGERY — TRANSVAGINAL TAPE (TVT) PROCEDURE
Anesthesia: General

## 2011-04-27 MED ORDER — SIMETHICONE 80 MG PO CHEW: 80.0000 mg | CHEWABLE_TABLET | Freq: Four times a day (QID) | ORAL | Status: DC | PRN

## 2011-04-27 MED ORDER — DOCUSATE SODIUM 100 MG PO CAPS
100.0000 mg | ORAL_CAPSULE | Freq: Every day | ORAL | Status: DC
  Filled 2011-04-27 (×2): qty 1

## 2011-04-27 MED ORDER — TRAMADOL HCL 50 MG PO TABS
50.0000 mg | ORAL_TABLET | Freq: Four times a day (QID) | ORAL | Status: DC | PRN
  Filled 2011-04-27: qty 1

## 2011-04-27 MED ORDER — DEXAMETHASONE SODIUM PHOSPHATE 10 MG/ML IJ SOLN
INTRAMUSCULAR | Status: AC
  Filled 2011-04-27: qty 1

## 2011-04-27 MED ORDER — MAGNESIUM CITRATE PO SOLN
296.0000 mL | Freq: Every day | ORAL | Status: DC | PRN
  Filled 2011-04-27: qty 296

## 2011-04-27 MED ORDER — LACTATED RINGERS IV SOLN
INTRAVENOUS | Status: DC
  Administered 2011-04-27: 11:00:00 via INTRAVENOUS

## 2011-04-27 MED ORDER — MAGNESIUM HYDROXIDE 400 MG/5ML PO SUSP
30.0000 mL | Freq: Every day | ORAL | Status: DC | PRN
  Filled 2011-04-27: qty 30

## 2011-04-27 MED ORDER — KETOROLAC TROMETHAMINE 30 MG/ML IJ SOLN
INTRAMUSCULAR | Status: DC | PRN
  Administered 2011-04-27: 30 mg via INTRAVENOUS

## 2011-04-27 MED ORDER — CEFAZOLIN SODIUM 1-5 GM-% IV SOLN: 1.0000 g | INTRAVENOUS | Status: DC

## 2011-04-27 MED ORDER — MIDAZOLAM HCL 5 MG/5ML IJ SOLN
INTRAMUSCULAR | Status: DC | PRN
  Administered 2011-04-27: 1.5 mg via INTRAVENOUS

## 2011-04-27 MED ORDER — ONDANSETRON HCL 4 MG/2ML IJ SOLN
INTRAMUSCULAR | Status: DC | PRN
  Administered 2011-04-27: 4 mg via INTRAVENOUS

## 2011-04-27 MED ORDER — FENTANYL CITRATE 0.05 MG/ML IJ SOLN
25.0000 ug | INTRAMUSCULAR | Status: DC | PRN
  Administered 2011-04-27: 50 ug via INTRAVENOUS

## 2011-04-27 MED ORDER — FENTANYL CITRATE 0.05 MG/ML IJ SOLN
INTRAMUSCULAR | Status: AC
  Filled 2011-04-27: qty 2

## 2011-04-27 MED ORDER — FENTANYL CITRATE 0.05 MG/ML IJ SOLN
INTRAMUSCULAR | Status: DC | PRN
  Administered 2011-04-27 (×2): 50 ug via INTRAVENOUS

## 2011-04-27 MED ORDER — SODIUM CHLORIDE 0.9 % IJ SOLN
INTRAMUSCULAR | Status: DC | PRN
  Administered 2011-04-27: 60 mL

## 2011-04-27 MED ORDER — ONDANSETRON HCL 4 MG/2ML IJ SOLN: 4.0000 mg | Freq: Once | INTRAMUSCULAR | Status: DC | PRN

## 2011-04-27 MED ORDER — CEFAZOLIN SODIUM 1-5 GM-% IV SOLN
INTRAVENOUS | Status: AC
  Administered 2011-04-27: 1 g via INTRAVENOUS
  Filled 2011-04-27: qty 50

## 2011-04-27 MED ORDER — BISACODYL 5 MG PO TBEC
5.0000 mg | DELAYED_RELEASE_TABLET | Freq: Every day | ORAL | Status: DC | PRN
  Filled 2011-04-27: qty 1

## 2011-04-27 MED ORDER — LIDOCAINE HCL (CARDIAC) 20 MG/ML IV SOLN
INTRAVENOUS | Status: AC
  Filled 2011-04-27: qty 5

## 2011-04-27 MED ORDER — FENTANYL CITRATE 0.05 MG/ML IJ SOLN
INTRAMUSCULAR | Status: AC
  Administered 2011-04-27: 50 ug via INTRAVENOUS
  Filled 2011-04-27: qty 2

## 2011-04-27 MED ORDER — ALUM & MAG HYDROXIDE-SIMETH 200-200-20 MG/5ML PO SUSP
30.0000 mL | ORAL | Status: DC | PRN
  Filled 2011-04-27: qty 30

## 2011-04-27 MED ORDER — BISACODYL 10 MG RE SUPP
10.0000 mg | Freq: Every day | RECTAL | Status: DC | PRN
  Filled 2011-04-27: qty 1

## 2011-04-27 MED ORDER — PROPOFOL 10 MG/ML IV EMUL
INTRAVENOUS | Status: AC
  Filled 2011-04-27: qty 20

## 2011-04-27 MED ORDER — KETOROLAC TROMETHAMINE 30 MG/ML IJ SOLN
INTRAMUSCULAR | Status: AC
  Filled 2011-04-27: qty 1

## 2011-04-27 MED ORDER — SENNOSIDES-DOCUSATE SODIUM 8.6-50 MG PO TABS: 2.0000 | ORAL_TABLET | Freq: Every day | ORAL | Status: DC | PRN

## 2011-04-27 MED ORDER — OXYCODONE-ACETAMINOPHEN 5-325 MG PO TABS: 1.0000 | ORAL_TABLET | ORAL | Status: AC | PRN

## 2011-04-27 MED ORDER — BUPIVACAINE-EPINEPHRINE 0.25% -1:200000 IJ SOLN
INTRAMUSCULAR | Status: DC | PRN
  Administered 2011-04-27: 105 mL

## 2011-04-27 MED ORDER — MIDAZOLAM HCL 2 MG/2ML IJ SOLN
INTRAMUSCULAR | Status: AC
  Filled 2011-04-27: qty 2

## 2011-04-27 MED ORDER — ONDANSETRON HCL 4 MG/2ML IJ SOLN
INTRAMUSCULAR | Status: AC
  Filled 2011-04-27: qty 2

## 2011-04-27 MED ORDER — ACETAMINOPHEN 325 MG PO TABS: 325.0000 mg | ORAL_TABLET | ORAL | Status: DC | PRN

## 2011-04-27 MED ORDER — MEPERIDINE HCL 25 MG/ML IJ SOLN: 6.2500 mg | INTRAMUSCULAR | Status: DC | PRN

## 2011-04-27 MED ORDER — STERILE WATER FOR IRRIGATION IR SOLN
Status: DC | PRN
  Administered 2011-04-27: 1000 mL via INTRAVESICAL

## 2011-04-27 SURGICAL SUPPLY — 22 items
BLADE SURG 15 STRL LF C SS BP (BLADE) ×2 IMPLANT
BLADE SURG 15 STRL SS (BLADE) ×2
CANISTER SUCTION 2500CC (MISCELLANEOUS) ×2 IMPLANT
CATH FOLEY 2WAY SLVR  5CC 18FR (CATHETERS) ×1
CATH FOLEY 2WAY SLVR 5CC 18FR (CATHETERS) ×1 IMPLANT
CLOTH BEACON ORANGE TIMEOUT ST (SAFETY) ×2 IMPLANT
DECANTER SPIKE VIAL GLASS SM (MISCELLANEOUS) ×2 IMPLANT
DERMABOND ADVANCED (GAUZE/BANDAGES/DRESSINGS) ×2 IMPLANT
DRAPE CAMERA CLOSED 9X96 (DRAPES) ×2 IMPLANT
DRAPE HYSTEROSCOPY (DRAPE) ×2 IMPLANT
DRAPE UTILITY XL STRL (DRAPES) ×2 IMPLANT
GLOVE BIO SURGEON STRL SZ7.5 (GLOVE) ×4 IMPLANT
GOWN BRE IMP SLV AUR LG STRL (GOWN DISPOSABLE) ×4 IMPLANT
GOWN BRE IMP SLV AUR XL STRL (GOWN DISPOSABLE) ×2 IMPLANT
NS IRRIG 1000ML POUR BTL (IV SOLUTION) ×2 IMPLANT
PACK VAGINAL WOMENS (CUSTOM PROCEDURE TRAY) ×2 IMPLANT
SET CYSTO W/LG BORE CLAMP LF (SET/KITS/TRAYS/PACK) ×2 IMPLANT
SLING TVT EXACT (Sling) ×2 IMPLANT
SUT VICRYL RAPIDE 3 0 (SUTURE) ×4 IMPLANT
TOWEL OR 17X24 6PK STRL BLUE (TOWEL DISPOSABLE) ×4 IMPLANT
TRAY FOLEY BAG SILVER LF 16FR (CATHETERS) ×2 IMPLANT
WATER STERILE IRR 1000ML POUR (IV SOLUTION) ×2 IMPLANT

## 2011-04-27 NOTE — Anesthesia Preprocedure Evaluation (Addendum)
Anesthesia Evaluation  Name, MR# and DOB Patient awake  General Assessment Comment  Reviewed: Allergy & Precautions, H&P , Patient's Chart, lab work & pertinent test results and reviewed documented beta blocker date and time   Airway Mallampati: II TM Distance: >3 FB Neck ROM: full    Dental No notable dental hx    Pulmonary  asthma (Rare inhaler use,  last in Dec)  clear to auscultation  pulmonary exam normal   Cardiovascular regular Normal   Neuro/PsychNegative Neurological ROS Negative Psych ROS  GI/Hepatic/Renal negative GI ROS, negative Liver ROS, and negative Renal ROS (+)       Endo/Other  Negative Endocrine ROS (+)   Abdominal   Musculoskeletal  Hematology negative hematology ROS (+)   Peds  Reproductive/Obstetrics   Anesthesia Other Findings             Anesthesia Physical Anesthesia Plan  ASA: II  Anesthesia Plan: General   Post-op Pain Management:    Induction: Intravenous  Airway Management Planned: LMA  Additional Equipment:   Intra-op Plan:   Post-operative Plan:   Informed Consent: I have reviewed the patients History and Physical, chart, labs and discussed the procedure including the risks, benefits and alternatives for the proposed anesthesia with the patient or authorized representative who has indicated his/her understanding and acceptance.   Dental Advisory Given  Plan Discussed with: CRNA and Surgeon  Anesthesia Plan Comments: (  Discussed  general anesthesia, including possible nausea, instrumentation of airway, sore throat,pulmonary aspiration, etc. I asked if the were any outstanding questions, or  concerns before we proceeded. )        Anesthesia Quick Evaluation

## 2011-04-27 NOTE — Progress Notes (Signed)
Patient voided 100cc in hat but also voided in toilet as well.  Orders required pt to void 150cc to be able to go home.  Dr Billy Coast called and wanted bladder scan done.  Patient back to pacu bed for scan.  Scanned showed 131cc (x two readings).  Ok to d/c patient home if scan showed 150cc or less per T/O Dr Suzie Portela, RN.  T/O verified.  Ok to d/c patient home.

## 2011-04-27 NOTE — Anesthesia Postprocedure Evaluation (Signed)
  Anesthesia Post-op Note  Patient: Stacey Esparza  Procedure(s) Performed:  TRANSVAGINAL TAPE (TVT) PROCED Patient is awake and responsive. Pain and nausea are reasonably well controlled. Vital signs are stable and clinically acceptable. Oxygen saturation is clinically acceptable. There are no apparent anesthetic complications at this time. Patient is ready for discharge.

## 2011-04-27 NOTE — Transfer of Care (Signed)
Immediate Anesthesia Transfer of Care Note  Patient: Stacey Esparza  Procedure(s) Performed:  TRANSVAGINAL TAPE (TVT) PROCEDURE; CYSTOSCOPY - Bladder  Patient Location: PACU  Anesthesia Type: General  Level of Consciousness: awake, alert  and oriented  Airway & Oxygen Therapy: Patient Spontanous Breathing and Patient connected to nasal cannula oxygen  Post-op Assessment: Report given to PACU RN  Post vital signs: Reviewed and stable  Complications: No apparent anesthesia complications

## 2011-04-27 NOTE — Progress Notes (Signed)
Bladder instilled with 300 ML of NS, Foley cath discontinued.  Patient up to bathroom, Phase 2 care.  Will monitor urine output.

## 2011-04-27 NOTE — Op Note (Signed)
Operative note for [facility].  Preoperative diagnosis: Stress urinary incontinence Postoperative diagnosis: same Procedure: TVT Exact with cystoscopy Surgeon: Billy Coast Asst.: Mody Estimated blood loss: Minimal Complications: None Drains: Foley Patient to recovery room in good condition. Specimen: None  Description of procedure: After being apprised of the risks of anesthesia, infection, bleeding, injury to bowel and bladder with possible need for repair, patient's consents are signed and patient is brought to the operating room. After achieving adequate anesthesia fetus are placed in the yellowfin stirrups at approximately 60 angle. Exam under anesthesia reveals a small cystocele but no pelvic or adnexal masses. At this time a 56 French Foley catheter was placed to straight drainage. Landmarks are found in the suprapubic area 2 cm to the left and right of the pubic midline and markings were placed. Dilute  Marcaine solution is infiltrated in the abdominal incisions 30 cc into the space of Retzius bilaterally. 10 cc into the vaginal mucosa approximately 2 cm distal to the external urethral meatus. A small 1 cm midline incision is made. Additional Marcaine solution is placed. 10 cc bilaterally left and right of the vaginal incision and then 10 cc bilaterally underneath her pubic ramus. Good hemostasis is noted dissection is performed bilaterally using Metzenbaum scissors to the level of the pubic ramus. TVT exact is then loaded. TVT Exact is then placed in the standard fashion on the right side and a directed up to  the previously noted abdominal marked incision. TVT device was then placed on the left side in the same fashion. At this time Foley catheter is removed. Cystoscopy was performed. A 70 cystoscope is used to observe the entire bladder wall with specific attention to 9 and 11:00 on the patient's right and 1 to 3:00 on the patient's left and there is no evidence of bladder perforation or  change in integrity of the bladder mucosa. Bilateral ureteral openings are noted to be peristalsing normally with reflux of clear urine. At this time cystoscope was removed. A 16 French Foley catheter was placed to straight drainage and the bladder is drained. The TVT exact is tensioned in the standard fashion placing a right angle clamp behind the mesh to assure loose mid urethral placement. At this time the TVT mesh is exposed removing the plastic and it is cut at the level of the abdominal wall. Appropriate tension is noted on the TVT mesh. The abdominal incisions are closed using Dermabond. The vaginal incision was closed using a 3-0 Vicryl repeat in a continuous running fashion. Patient's tolerates procedure well. Clear urine is noted. Patient is awakened and transferred to Recovery room in good condition.

## 2011-04-28 NOTE — Anesthesia Postprocedure Evaluation (Signed)
  Anesthesia Post-op Note  Patient: Stacey Esparza  Procedure(s) Performed:  TRANSVAGINAL TAPE (TVT) PROCEDURE; CYSTOSCOPY - Bladder  Patient is awake and responsive. Pain and nausea are reasonably well controlled. Vital signs are stable and clinically acceptable. Oxygen saturation is clinically acceptable. There are no apparent anesthetic complications at this time. Patient is ready for discharge.

## 2011-05-17 ENCOUNTER — Encounter (HOSPITAL_COMMUNITY): Payer: Self-pay | Admitting: Obstetrics and Gynecology

## 2011-05-22 ENCOUNTER — Inpatient Hospital Stay (HOSPITAL_COMMUNITY): Admission: RE | Admit: 2011-05-22 | Payer: Federal, State, Local not specified - PPO | Source: Ambulatory Visit

## 2013-05-02 ENCOUNTER — Encounter (HOSPITAL_COMMUNITY): Payer: Self-pay | Admitting: Emergency Medicine

## 2013-05-02 ENCOUNTER — Emergency Department (HOSPITAL_COMMUNITY)
Admission: EM | Admit: 2013-05-02 | Discharge: 2013-05-02 | Disposition: A | Discharge status: Home / Self Care | Payer: Federal, State, Local not specified - PPO | Source: Home / Self Care

## 2013-05-02 DIAGNOSIS — N390 Urinary tract infection, site not specified: Secondary | ICD-10-CM

## 2013-05-02 LAB — POCT URINALYSIS DIP (DEVICE)
Glucose, UA: NEGATIVE mg/dL
Ketones, ur: NEGATIVE mg/dL
Specific Gravity, Urine: 1.015 (ref 1.005–1.030)

## 2013-05-02 MED ORDER — CEPHALEXIN 250 MG PO CAPS: 500.0000 mg | ORAL_CAPSULE | Freq: Four times a day (QID) | ORAL | Status: DC

## 2013-05-02 NOTE — ED Provider Notes (Signed)
Medical screening examination/treatment/procedure(s) were performed by a resident physician or non-physician practitioner and as the supervising physician I was immediately available for consultation/collaboration.  Clementeen Graham, MD   Rodolph Bong, MD 05/02/13 1027

## 2013-05-02 NOTE — ED Provider Notes (Signed)
History    CSN: [id number] Arrival date & time 05/02/13  0801  None    No chief complaint on file.  (Consider location/radiation/quality/duration/timing/severity/associated sxs/prior Treatment) HPI Comments: 42 year old female presents with "bladder pressure" urinary urgency, mildly sure he and a cloudy malodorous urine. Denies back pain, fever or pelvic pain. Denies vaginal discharge.  Past Medical History  Diagnosis Date  . Asthma     seasonal - inhaler once per year, asked pt to bring with her day of surgery  . Headache     seasonal - not migraines   Past Surgical History  Procedure Laterality Date  . C-sections x 2. 2008, 2010    . Laparoscopic supracervical hysterectomy  2010    during c-section, known placenta previa  . Bladder suspension  04/27/2011    Procedure: TRANSVAGINAL TAPE (TVT) PROCEDURE;  Surgeon: Lenoard Aden, MD;  Location: [facility];  Service: Gynecology;  Laterality: N/A;  . Cystoscopy  04/27/2011    Procedure: CYSTOSCOPY;  Surgeon: Lenoard Aden, MD;  Location: [facility];  Service: Gynecology;  Laterality: N/A;  Bladder   No family history on file. History  Substance Use Topics  . Smoking status: Never Smoker   . Smokeless tobacco: Not on file  . Alcohol Use: 0.6 oz/week    1 Glasses of wine per week   OB History   Grav Para Term Preterm Abortions TAB SAB Ect Mult Living                 Review of Systems  Constitutional: Negative.   Respiratory: Negative.   Gastrointestinal: Negative.   Genitourinary: Positive for dysuria, urgency and frequency. Negative for flank pain, vaginal discharge, difficulty urinating and pelvic pain.  Neurological: Negative.     Allergies  Review of patient's allergies indicates no known allergies.  Home Medications   Current Outpatient Rx  Name  Route  Sig  Dispense  Refill  . cephALEXin (KEFLEX) 250 MG capsule   Oral   Take 2 capsules (500 mg total) by mouth 4 (four) times daily.   28 capsule   0    BP  124/75  Pulse 72  Temp(Src) 97.9 F (36.6 C) (Oral)  Resp 16  SpO2 100% Physical Exam  Nursing note and vitals reviewed. Constitutional: She is oriented to person, place, and time. She appears well-developed and well-nourished.  Neck: Normal range of motion. Neck supple.  Cardiovascular: Normal rate and regular rhythm.   Pulmonary/Chest: Effort normal and breath sounds normal.  Abdominal: Soft. She exhibits no distension. There is no tenderness. There is no rebound and no guarding.  Genitourinary:  Is abdominopelvic palpation does not reveal tenderness in the pelvis.  Musculoskeletal: She exhibits no edema.  Neurological: She is alert and oriented to person, place, and time. She exhibits normal muscle tone.  Skin: Skin is warm and dry. No rash noted.  Psychiatric: She has a normal mood and affect.    ED Course  Procedures (including critical care time) Labs Reviewed  POCT URINALYSIS DIP (DEVICE) - Abnormal; Notable for the following:    Hgb urine dipstick TRACE (*)    pH 8.5 (*)    Leukocytes, UA MODERATE (*)    All other components within normal limits  URINE CULTURE   No results found. 1. UTI (lower urinary tract infection)     MDM  Plenty of fluids Keflex 500 mg 4 times a day for 7 days May take AZO OTC as directed Urine culture obtained.  Onalee Hua  Shenelle Klas, NP 05/02/13 [phone]

## 2013-05-02 NOTE — ED Notes (Signed)
C/o uti symptoms for 3 days

## 2013-05-04 LAB — URINE CULTURE
Colony Count: 30000
Special Requests: NORMAL

## 2013-05-06 NOTE — ED Notes (Signed)
Urine culture: 30,000 colonies Proteus Mirabilis.  Pt. adequately treated with Keflex. Vassie Moselle 05/06/2013

## 2013-08-29 ENCOUNTER — Other Ambulatory Visit: Payer: Self-pay

## 2013-08-29 DIAGNOSIS — Z1231 Encounter for screening mammogram for malignant neoplasm of breast: Secondary | ICD-10-CM

## 2013-09-04 ENCOUNTER — Encounter (HOSPITAL_COMMUNITY): Payer: Self-pay | Admitting: Emergency Medicine

## 2013-09-04 ENCOUNTER — Emergency Department (HOSPITAL_COMMUNITY)
Admission: EM | Admit: 2013-09-04 | Discharge: 2013-09-04 | Disposition: A | Discharge status: Home / Self Care | Payer: Federal, State, Local not specified - PPO | Source: Home / Self Care | Attending: Emergency Medicine | Admitting: Emergency Medicine

## 2013-09-04 DIAGNOSIS — N10 Acute tubulo-interstitial nephritis: Secondary | ICD-10-CM

## 2013-09-04 LAB — POCT URINALYSIS DIP (DEVICE)
Nitrite: NEGATIVE
Protein, ur: NEGATIVE mg/dL
Urobilinogen, UA: 0.2 mg/dL (ref 0.0–1.0)
pH: 7 (ref 5.0–8.0)

## 2013-09-04 LAB — CBC WITH DIFFERENTIAL/PLATELET
Basophils Absolute: 0.1 10*3/uL (ref 0.0–0.1)
Basophils Relative: 1 % (ref 0–1)
Eosinophils Absolute: 0.2 10*3/uL (ref 0.0–0.7)
MCH: 30.1 pg (ref 26.0–34.0)
MCHC: 34.4 g/dL (ref 30.0–36.0)
Monocytes Relative: 5 % (ref 3–12)
Neutrophils Relative %: 62 % (ref 43–77)
Platelets: 328 10*3/uL (ref 150–400)
RDW: 13.8 % (ref 11.5–15.5)

## 2013-09-04 LAB — POCT I-STAT, CHEM 8
BUN: 19 mg/dL (ref 6–23)
Chloride: 108 mEq/L (ref 96–112)
Glucose, Bld: 128 mg/dL — ABNORMAL HIGH (ref 70–99)
HCT: 47 % — ABNORMAL HIGH (ref 36.0–46.0)
Potassium: 4.4 mEq/L (ref 3.5–5.1)

## 2013-09-04 MED ORDER — CEFTRIAXONE SODIUM 1 G IJ SOLR
1.0000 g | Freq: Once | INTRAMUSCULAR | Status: AC
  Administered 2013-09-04: 1 g via INTRAMUSCULAR

## 2013-09-04 MED ORDER — LEVOFLOXACIN 750 MG PO TABS: 750.0000 mg | ORAL_TABLET | Freq: Every day | ORAL | Status: DC

## 2013-09-04 MED ORDER — HYDROCODONE-ACETAMINOPHEN 5-325 MG PO TABS: 1.0000 | ORAL_TABLET | ORAL | Status: DC | PRN

## 2013-09-04 MED ORDER — CEFTRIAXONE SODIUM 1 G IJ SOLR
INTRAMUSCULAR | Status: AC
  Filled 2013-09-04: qty 10

## 2013-09-04 MED ORDER — LIDOCAINE HCL (PF) 1 % IJ SOLN
INTRAMUSCULAR | Status: AC
  Filled 2013-09-04: qty 5

## 2013-09-04 MED ORDER — KETOROLAC TROMETHAMINE 60 MG/2ML IM SOLN
INTRAMUSCULAR | Status: AC
  Filled 2013-09-04: qty 2

## 2013-09-04 MED ORDER — ONDANSETRON 4 MG PO TBDP
8.0000 mg | ORAL_TABLET | Freq: Once | ORAL | Status: AC
  Administered 2013-09-04: 8 mg via ORAL

## 2013-09-04 MED ORDER — ONDANSETRON 4 MG PO TBDP
ORAL_TABLET | ORAL | Status: AC
  Filled 2013-09-04: qty 2

## 2013-09-04 MED ORDER — ONDANSETRON 8 MG PO TBDP: 8.0000 mg | ORAL_TABLET | Freq: Three times a day (TID) | ORAL | Status: DC | PRN

## 2013-09-04 MED ORDER — KETOROLAC TROMETHAMINE 60 MG/2ML IM SOLN
60.0000 mg | Freq: Once | INTRAMUSCULAR | Status: AC
  Administered 2013-09-04: 60 mg via INTRAMUSCULAR

## 2013-09-04 NOTE — ED Notes (Signed)
Pt c/o poss UTI onset 4 days and her abd started to hurt today Sxs include: urinary urgency Denies: urinary freq, dysuria, hematuria, gyn sxs, f/v/n/d She is alert w/no signs of acute distress.

## 2013-09-04 NOTE — ED Provider Notes (Signed)
Medical screening examination/treatment/procedure(s) were performed by non-physician practitioner and as supervising physician I was immediately available for consultation/collaboration.  Dominque Marlin, M.D.  Yobana Culliton C Deryl Giroux, MD 09/04/13 2101 

## 2013-09-04 NOTE — ED Provider Notes (Signed)
CSN: [id number]     Arrival date & time 09/04/13  1805 History   First MD Initiated Contact with Patient 09/04/13 1902     Chief Complaint  Patient presents with  . Urinary Tract Infection   (Consider location/radiation/quality/duration/timing/severity/associated sxs/prior Treatment) HPI Comments: 42 year old female presents complaining of being sick for 4 days. Starting 4 days ago, she began to have urinary frequency and urgency. The next day, she started to have some abdominal pain that has gotten progressively worse. Just since she has been here, she started to have some back pain on her right side and some nausea. She has a history of kidney stones, one time 20 years ago, and wonders if that could be what is happening. She denies fever, chills, vomiting. She did not have any back or flank pain until about an hour ago. No gross hematuria.  Patient is a 42 y.o. female presenting with urinary tract infection.  Urinary Tract Infection Associated symptoms include abdominal pain. Pertinent negatives include no chest pain and no shortness of breath.    Past Medical History  Diagnosis Date  . Asthma     seasonal - inhaler once per year, asked pt to bring with her day of surgery  . Headache(784.0)     seasonal - not migraines   Past Surgical History  Procedure Laterality Date  . C-sections x 2. 2008, 2010    . Laparoscopic supracervical hysterectomy  2010    during c-section, known placenta previa  . Bladder suspension  04/27/2011    Procedure: TRANSVAGINAL TAPE (TVT) PROCEDURE;  Surgeon: Lenoard Aden, MD;  Location: [facility];  Service: Gynecology;  Laterality: N/A;  . Cystoscopy  04/27/2011    Procedure: CYSTOSCOPY;  Surgeon: Lenoard Aden, MD;  Location: [facility];  Service: Gynecology;  Laterality: N/A;  Bladder   No family history on file. History  Substance Use Topics  . Smoking status: Never Smoker   . Smokeless tobacco: Not on file  . Alcohol Use: 0.6 oz/week    1 Glasses of  wine per week   OB History   Grav Para Term Preterm Abortions TAB SAB Ect Mult Living                 Review of Systems  Constitutional: Negative for fever and chills.  Eyes: Negative for visual disturbance.  Respiratory: Negative for cough and shortness of breath.   Cardiovascular: Negative for chest pain, palpitations and leg swelling.  Gastrointestinal: Positive for nausea and abdominal pain. Negative for vomiting.  Endocrine: Negative for polydipsia and polyuria.  Genitourinary: Positive for dysuria, frequency and flank pain. Negative for urgency.  Musculoskeletal: Negative for arthralgias and myalgias.  Skin: Negative for rash.  Neurological: Negative for dizziness, weakness and light-headedness.    Allergies  Review of patient's allergies indicates no known allergies.  Home Medications   Current Outpatient Rx  Name  Route  Sig  Dispense  Refill  . cephALEXin (KEFLEX) 250 MG capsule   Oral   Take 2 capsules (500 mg total) by mouth 4 (four) times daily.   28 capsule   0   . HYDROcodone-acetaminophen (NORCO) 5-325 MG per tablet   Oral   Take 1 tablet by mouth every 4 (four) hours as needed for moderate pain.   15 tablet   0   . levofloxacin (LEVAQUIN) 750 MG tablet   Oral   Take 1 tablet (750 mg total) by mouth daily.   5 tablet   0   .  meloxicam (MOBIC) 15 MG tablet   Oral   Take 15 mg by mouth daily.         . ondansetron (ZOFRAN ODT) 8 MG disintegrating tablet   Oral   Take 1 tablet (8 mg total) by mouth every 8 (eight) hours as needed for nausea or vomiting.   12 tablet   0    BP 122/87  Pulse 82  Temp(Src) 98.3 F (36.8 C) (Oral)  Resp 18  SpO2 100% Physical Exam  Nursing note and vitals reviewed. Constitutional: She is oriented to person, place, and time. Vital signs are normal. She appears well-developed and well-nourished. No distress.  HENT:  Head: Normocephalic and atraumatic.  Cardiovascular: Normal rate, regular rhythm and normal  heart sounds.  Exam reveals no gallop and no friction rub.   No murmur heard. Pulmonary/Chest: Effort normal and breath sounds normal. No respiratory distress. She has no wheezes. She has no rales.  Abdominal: Soft. There is tenderness in the epigastric area. There is CVA tenderness (right-sided).  Neurological: She is alert and oriented to person, place, and time. She has normal strength. Coordination normal.  Skin: Skin is warm and dry. No rash noted. She is not diaphoretic.  Psychiatric: She has a normal mood and affect. Judgment normal.    ED Course  Procedures (including critical care time) Labs Review Labs Reviewed  CBC WITH DIFFERENTIAL - Abnormal; Notable for the following:    WBC 12.6 (*)    Hemoglobin 15.1 (*)    Neutro Abs 7.8 (*)    All other components within normal limits  POCT URINALYSIS DIP (DEVICE) - Abnormal; Notable for the following:    Hgb urine dipstick MODERATE (*)    Leukocytes, UA LARGE (*)    All other components within normal limits  POCT I-STAT, CHEM 8 - Abnormal; Notable for the following:    Glucose, Bld 128 (*)    Hemoglobin 16.0 (*)    HCT 47.0 (*)    All other components within normal limits  URINE CULTURE   Imaging Review No results found.  Giving Toradol and Zofran here for pain and nausea. Also giving a dose of ceftriaxone. Checking i-STAT, CBC with differential.  MDM   1. Acute pyelonephritis    Leukocytosis with left shift. This is most likely pyelonephritis. Urine cultures. Treating with ceftriaxone here and discharge him on levofloxacin. Also, her creatinine is 1.1, I have asked her to get this rechecked in one week. Emergency department if worsening despite interventions.   Meds ordered this encounter  Medications  . ketorolac (TORADOL) injection 60 mg    Sig:   . cefTRIAXone (ROCEPHIN) injection 1 g    Sig:   . ondansetron (ZOFRAN-ODT) disintegrating tablet 8 mg    Sig:   . levofloxacin (LEVAQUIN) 750 MG tablet    Sig: Take 1  tablet (750 mg total) by mouth daily.    Dispense:  5 tablet    Refill:  0    Order Specific Question:  Supervising Provider    Answer:  Lorenz Coaster, DAVID C [id number]  . ondansetron (ZOFRAN ODT) 8 MG disintegrating tablet    Sig: Take 1 tablet (8 mg total) by mouth every 8 (eight) hours as needed for nausea or vomiting.    Dispense:  12 tablet    Refill:  0    Order Specific Question:  Supervising Provider    Answer:  Lorenz Coaster, DAVID C [id number]  . HYDROcodone-acetaminophen (NORCO) 5-325 MG per tablet    Sig:  Take 1 tablet by mouth every 4 (four) hours as needed for moderate pain.    Dispense:  15 tablet    Refill:  0    Order Specific Question:  Supervising Provider    Answer:  Lorenz Coaster, DAVID C [[id number]]       Graylon Good, PA-C 09/04/13 2049

## 2013-09-06 LAB — URINE CULTURE: Colony Count: >=100000

## 2013-09-10 NOTE — ED Notes (Signed)
Urine culture: >100,000 colonies Staph species (coagulase neg.). Pt. treated with Levaquin. Discussed with Almedia Balls PA and he said treatment is adequate. Vassie Moselle 09/10/2013

## 2013-10-28 ENCOUNTER — Ambulatory Visit
Admission: RE | Admit: 2013-10-28 | Discharge: 2013-10-28 | Disposition: A | Discharge status: Home / Self Care | Payer: Federal, State, Local not specified - PPO | Source: Ambulatory Visit

## 2013-10-28 DIAGNOSIS — Z1231 Encounter for screening mammogram for malignant neoplasm of breast: Secondary | ICD-10-CM

## 2014-09-01 ENCOUNTER — Emergency Department (HOSPITAL_COMMUNITY)
Admission: EM | Admit: 2014-09-01 | Discharge: 2014-09-01 | Disposition: A | Discharge status: Home / Self Care | Payer: Federal, State, Local not specified - PPO | Source: Home / Self Care | Attending: Family Medicine | Admitting: Family Medicine

## 2014-09-01 ENCOUNTER — Encounter (HOSPITAL_COMMUNITY): Payer: Self-pay | Admitting: Emergency Medicine

## 2014-09-01 DIAGNOSIS — J029 Acute pharyngitis, unspecified: Secondary | ICD-10-CM

## 2014-09-01 LAB — POCT RAPID STREP A: Streptococcus, Group A Screen (Direct): NEGATIVE

## 2014-09-01 NOTE — Discharge Instructions (Signed)
Salt Water Gargle This solution will help make your mouth and throat feel better. HOME CARE INSTRUCTIONS   Mix 1 teaspoon of salt in 8 ounces of warm water.  Gargle with this solution as much or often as you need or as directed. Swish and gargle gently if you have any sores or wounds in your mouth.  Do not swallow this mixture. Document Released: 06/30/2004 Document Revised: 12/19/2011 Document Reviewed: 11/21/2008 [facility] Patient Information 2015 [place]Care, [place]. This information is not intended to replace advice given to you by your health care provider. Make sure you discuss any questions you have with your health care provider.  Sore Throat A sore throat is pain, burning, irritation, or scratchiness of the throat. There is often pain or tenderness when swallowing or talking. A sore throat may be accompanied by other symptoms, such as coughing, sneezing, fever, and swollen neck glands. A sore throat is often the first sign of another sickness, such as a cold, flu, strep throat, or mononucleosis (commonly known as mono). Most sore throats go away without medical treatment. CAUSES  The most common causes of a sore throat include:  A viral infection, such as a cold, flu, or mono.  A bacterial infection, such as strep throat, tonsillitis, or whooping cough.  Seasonal allergies.  Dryness in the air.  Irritants, such as smoke or pollution.  Gastroesophageal reflux disease (GERD). HOME CARE INSTRUCTIONS   Only take over-the-counter medicines as directed by your caregiver.  Drink enough fluids to keep your urine clear or pale yellow.  Rest as needed.  Try using throat sprays, lozenges, or sucking on hard candy to ease any pain (if older than 4 years or as directed).  Sip warm liquids, such as broth, herbal tea, or warm water with honey to relieve pain temporarily. You may also eat or drink cold or frozen liquids such as frozen ice pops.  Gargle with salt water (mix 1 tsp salt  with 8 oz of water).  Do not smoke and avoid secondhand smoke.  Put a cool-mist humidifier in your bedroom at night to moisten the air. You can also turn on a hot shower and sit in the bathroom with the door closed for 5-10 minutes. SEEK IMMEDIATE MEDICAL CARE IF:  You have difficulty breathing.  You are unable to swallow fluids, soft foods, or your saliva.  You have increased swelling in the throat.  Your sore throat does not get better in 7 days.  You have nausea and vomiting.  You have a fever or persistent symptoms for more than 2-3 days.  You have a fever and your symptoms suddenly get worse. MAKE SURE YOU:   Understand these instructions.  Will watch your condition.  Will get help right away if you are not doing well or get worse. Document Released: 11/03/2004 Document Revised: 09/12/2012 Document Reviewed: 06/03/2012 [facility]Exit[facility] Patient Information 2015 [place] PlaceExitCare, MarylandLLC. This information is not intended to replace advice given to you by your health care provider. Make sure you discuss any questions you have with your health care provider. Rapid strep test negative Will hold for three day culture. If results indicate the need for additional treatment you will be notified by phone. Tylenol or ibuprofen as indicated on packaging for pain. Salt water gargles Expect improvement over the next few days.   Pharyngitis Pharyngitis is redness, pain, and swelling (inflammation) of your pharynx.  CAUSES  Pharyngitis is usually caused by infection. Most of the time, these infections are from viruses (viral) and  are part of a cold. However, sometimes pharyngitis is caused by bacteria (bacterial). Pharyngitis can also be caused by allergies. Viral pharyngitis may be spread from person to person by coughing, sneezing, and personal items or utensils (cups, forks, spoons, toothbrushes). Bacterial pharyngitis may be spread from person to person by more intimate contact, such as kissing.  SIGNS  AND SYMPTOMS  Symptoms of pharyngitis include:   Sore throat.   Tiredness (fatigue).   Low-grade fever.   Headache.  Joint pain and muscle aches.  Skin rashes.  Swollen lymph nodes.  Plaque-like film on throat or tonsils (often seen with bacterial pharyngitis). DIAGNOSIS  Your health care provider will ask you questions about your illness and your symptoms. Your medical history, along with a physical exam, is often all that is needed to diagnose pharyngitis. Sometimes, a rapid strep test is done. Other lab tests may also be done, depending on the suspected cause.  TREATMENT  Viral pharyngitis will usually get better in 3-4 days without the use of medicine. Bacterial pharyngitis is treated with medicines that kill germs (antibiotics).  HOME CARE INSTRUCTIONS   Drink enough water and fluids to keep your urine clear or pale yellow.   Only take over-the-counter or prescription medicines as directed by your health care provider:   If you are prescribed antibiotics, make sure you finish them even if you start to feel better.   Do not take aspirin.   Get lots of rest.   Gargle with 8 oz of salt water ( tsp of salt per 1 qt of water) as often as every 1-2 hours to soothe your throat.   Throat lozenges (if you are not at risk for choking) or sprays may be used to soothe your throat. SEEK MEDICAL CARE IF:   You have large, tender lumps in your neck.  You have a rash.  You cough up green, yellow-brown, or bloody spit. SEEK IMMEDIATE MEDICAL CARE IF:   Your neck becomes stiff.  You drool or are unable to swallow liquids.  You vomit or are unable to keep medicines or liquids down.  You have severe pain that does not go away with the use of recommended medicines.  You have trouble breathing (not caused by a stuffy nose). MAKE SURE YOU:   Understand these instructions.  Will watch your condition.  Will get help right away if you are not doing well or get  worse. Document Released: 09/26/2005 Document Revised: 07/17/2013 Document Reviewed: 06/03/2013 [facility] CenterExitCare Patient Information 2015 [place]ExitCare, MarylandLLC. This information is not intended to replace advice given to you by your health care provider. Make sure you discuss any questions you have with your health care provider.

## 2014-09-01 NOTE — ED Notes (Signed)
Pt  States that she has had a sore throat since last night and ear pain for over a couple of weeks.

## 2014-09-01 NOTE — ED Provider Notes (Signed)
CSN: [id number]     Arrival date & time 09/01/14  1056 History   First MD Initiated Contact with Patient 09/01/14 1216     Chief Complaint  Patient presents with  . Sore Throat  . Otalgia   (Consider location/radiation/quality/duration/timing/severity/associated sxs/prior Treatment) HPI Comments: Pain radiates to her right ear No fever Daughter ill with URI sx O/W in good health Works as International aid/development workerinvestigator Nonsmoker  Patient is a 43 y.o. female presenting with pharyngitis and ear pain. The history is provided by the patient.  Sore Throat This is a new problem. The current episode started yesterday. The problem occurs constantly. The problem has not changed since onset. Otalgia   Past Medical History  Diagnosis Date  . Asthma     seasonal - inhaler once per year, asked pt to bring with her day of surgery  . Headache(784.0)     seasonal - not migraines   Past Surgical History  Procedure Laterality Date  . C-sections x 2. 2008, 2010    . Laparoscopic supracervical hysterectomy  2010    during c-section, known placenta previa  . Bladder suspension  04/27/2011    Procedure: TRANSVAGINAL TAPE (TVT) PROCEDURE;  Surgeon: Lenoard Adenichard J Taavon, MD;  Location: [facility];  Service: Gynecology;  Laterality: N/A;  . Cystoscopy  04/27/2011    Procedure: CYSTOSCOPY;  Surgeon: Lenoard Adenichard J Taavon, MD;  Location: [facility];  Service: Gynecology;  Laterality: N/A;  Bladder   History reviewed. No pertinent family history. History  Substance Use Topics  . Smoking status: Never Smoker   . Smokeless tobacco: Not on file  . Alcohol Use: 0.6 oz/week    1 Glasses of wine per week   OB History    No data available     Review of Systems  HENT: Positive for ear pain.   All other systems reviewed and are negative.   Allergies  Review of patient's allergies indicates no known allergies.  Home Medications   Prior to Admission medications   Medication Sig Start Date End Date Taking? Authorizing Provider   cephALEXin (KEFLEX) 250 MG capsule Take 2 capsules (500 mg total) by mouth 4 (four) times daily. 05/02/13   Hayden Rasmussenavid Mabe, NP  HYDROcodone-acetaminophen (NORCO) 5-325 MG per tablet Take 1 tablet by mouth every 4 (four) hours as needed for moderate pain. 09/04/13   Adrian BlackwaterZachary H Baker, PA-C  levofloxacin (LEVAQUIN) 750 MG tablet Take 1 tablet (750 mg total) by mouth daily. 09/04/13   Graylon GoodZachary H Baker, PA-C  meloxicam (MOBIC) 15 MG tablet Take 15 mg by mouth daily.    Historical Provider, MD  ondansetron (ZOFRAN ODT) 8 MG disintegrating tablet Take 1 tablet (8 mg total) by mouth every 8 (eight) hours as needed for nausea or vomiting. 09/04/13   Adrian BlackwaterZachary H Baker, PA-C   BP 125/80 mmHg  Pulse 77  Temp(Src) 98 F (36.7 C) (Oral)  Resp 16  SpO2 98% Physical Exam  Constitutional: She is oriented to person, place, and time. She appears well-developed and well-nourished.  HENT:  Head: Normocephalic and atraumatic.  Right Ear: Hearing, tympanic membrane, external ear and ear canal normal.  Left Ear: Hearing, tympanic membrane, external ear and ear canal normal.  Nose: Nose normal.  Mouth/Throat: Uvula is midline, oropharynx is clear and moist and mucous membranes are normal.  Eyes: Conjunctivae are normal. No scleral icterus.  Neck: Normal range of motion. Neck supple.  Cardiovascular: Normal rate, regular rhythm and normal heart sounds.   Pulmonary/Chest: Effort normal and breath  sounds normal.  Musculoskeletal: Normal range of motion.  Lymphadenopathy:    She has no cervical adenopathy.  Neurological: She is alert and oriented to person, place, and time.  Skin: Skin is warm and dry.  Psychiatric: She has a normal mood and affect. Her behavior is normal.  Nursing note and vitals reviewed.   ED Course  Procedures (including critical care time) Labs Review Labs Reviewed  POCT RAPID STREP A ([facility] ONLY)    Imaging Review No results found.   MDM   1. Pharyngitis    Rapid strep test  negative Will hold for three day culture. If results indicate the need for additional treatment you will be notified by phone. Tylenol or ibuprofen as indicated on packaging for pain. Salt water gargles Expect improvement over the next few days.   Ria ClockJennifer Lee H Schyler Counsell, GeorgiaPA 09/01/14 [phone]

## 2014-09-03 LAB — CULTURE, GROUP A STREP

## 2015-06-29 ENCOUNTER — Other Ambulatory Visit: Payer: Self-pay

## 2015-06-29 DIAGNOSIS — Z1231 Encounter for screening mammogram for malignant neoplasm of breast: Secondary | ICD-10-CM

## 2015-07-03 ENCOUNTER — Ambulatory Visit
Admission: RE | Admit: 2015-07-03 | Discharge: 2015-07-03 | Disposition: A | Discharge status: Home / Self Care | Payer: PRIVATE HEALTH INSURANCE | Source: Ambulatory Visit

## 2015-07-03 DIAGNOSIS — Z1231 Encounter for screening mammogram for malignant neoplasm of breast: Secondary | ICD-10-CM

## 2016-09-30 ENCOUNTER — Other Ambulatory Visit: Payer: Self-pay | Admitting: Obstetrics and Gynecology

## 2016-09-30 DIAGNOSIS — Z1231 Encounter for screening mammogram for malignant neoplasm of breast: Secondary | ICD-10-CM

## 2016-11-03 ENCOUNTER — Ambulatory Visit
Admission: RE | Admit: 2016-11-03 | Discharge: 2016-11-03 | Disposition: A | Discharge status: Home / Self Care | Payer: PRIVATE HEALTH INSURANCE | Source: Ambulatory Visit | Attending: Obstetrics and Gynecology | Admitting: Obstetrics and Gynecology

## 2016-11-03 DIAGNOSIS — Z1231 Encounter for screening mammogram for malignant neoplasm of breast: Secondary | ICD-10-CM

## 2018-06-18 ENCOUNTER — Encounter (HOSPITAL_COMMUNITY): Payer: Self-pay | Admitting: Emergency Medicine

## 2018-06-18 ENCOUNTER — Ambulatory Visit (HOSPITAL_COMMUNITY)
Admission: EM | Admit: 2018-06-18 | Discharge: 2018-06-18 | Disposition: A | Discharge status: Home / Self Care | Payer: PRIVATE HEALTH INSURANCE | Attending: Family Medicine | Admitting: Family Medicine

## 2018-06-18 DIAGNOSIS — R42 Dizziness and giddiness: Secondary | ICD-10-CM | POA: Diagnosis not present

## 2018-06-18 MED ORDER — MECLIZINE HCL 12.5 MG PO TABS: 12.5000 mg | ORAL_TABLET | Freq: Three times a day (TID) | ORAL | 0 refills | Status: AC | PRN

## 2018-06-18 NOTE — Discharge Instructions (Addendum)
Call [facility] ear nose and throat tomorrow at 9 AM to set up an appointment Drink plenty of fluids Take meclizine (Antivert) for the dizziness Return if worse instead of better at any time

## 2018-06-18 NOTE — ED Provider Notes (Signed)
[facility]    CSN: [id number] Arrival date & time: 06/18/18  3953     History   Chief Complaint Chief Complaint  Patient presents with  . Dizziness    HPI Stacey Esparza is a 47 y.o. female.   HPI  Patient is here for dizziness.  She feels like she has "vertigo".  Is been going on for little over.  She states it started off with her feeling a spinning sensation and off-balance when she would look right to left.  Her head was holding still.  She states that she would have waves of nausea.  That is getting somewhat better but now she has nausea and spinning when she looks up and down.  She is developed a headache.  Mostly behind her left eye.  She does not have a history of migraines.  She does not have any nausea or vomiting.  No fever or chills.  No head injury.  No change in hearing.  She states that she is never had vertigo before.  She is been taking Mucinex.  Uses Nasonex chronically for her allergies.  She uses it twice a day.  She states she called an ENT doctor to be seen, she was told she needed a referral.  Past Medical History:  Diagnosis Date  . Asthma    seasonal - inhaler once per year, asked pt to bring with her day of surgery  . Headache(784.0)    seasonal - not migraines    There are no active problems to display for this patient.   Past Surgical History:  Procedure Laterality Date  . BLADDER SUSPENSION  04/27/2011   Procedure: TRANSVAGINAL TAPE (TVT) PROCEDURE;  Surgeon: Lenoard Aden, MD;  Location: [facility];  Service: Gynecology;  Laterality: N/A;  . c-sections x 2. 2008, 2010    . CYSTOSCOPY  04/27/2011   Procedure: CYSTOSCOPY;  Surgeon: Lenoard Aden, MD;  Location: [facility];  Service: Gynecology;  Laterality: N/A;  Bladder  . LAPAROSCOPIC SUPRACERVICAL HYSTERECTOMY  2010   during c-section, known placenta previa    OB History   None      Home Medications    Prior to Admission medications   Medication Sig Start Date End Date  Taking? Authorizing Provider  guaiFENesin (MUCINEX) 600 MG 12 hr tablet Take by mouth 2 (two) times daily.   Yes [provider]  mometasone (NASONEX) 50 MCG/ACT nasal spray Place 2 sprays into the nose daily.   Yes [provider]  meclizine (ANTIVERT) 12.5 MG tablet Take 1 tablet (12.5 mg total) by mouth 3 (three) times daily as needed for dizziness. 06/18/18   Eustace Moore, MD    Family History No family history on file.  Social History Social History   Tobacco Use  . Smoking status: Never Smoker  Substance Use Topics  . Alcohol use: Yes    Alcohol/week: 1.0 standard drinks    Types: 1 Glasses of wine per week  . Drug use: No     Allergies   Patient has no known allergies.   Review of Systems Review of Systems  Constitutional: Negative for chills, fatigue and fever.  HENT: Negative for dental problem, ear pain and sore throat.   Eyes: Negative for pain and visual disturbance.  Respiratory: Negative for cough and shortness of breath.   Cardiovascular: Negative for chest pain and palpitations.  Gastrointestinal: Negative for abdominal pain and vomiting.  Genitourinary: Negative for dysuria and hematuria.  Musculoskeletal: Positive  for arthralgias. Negative for back pain.       Right elbow is in a bandage, recent surgery  Skin: Negative for color change and rash.  Neurological: Positive for dizziness and headaches. Negative for seizures and syncope.  All other systems reviewed and are negative.    Physical Exam Triage Vital Signs ED Triage Vitals [06/18/18 1020]  Enc Vitals Group     BP 123/87     Pulse Rate 84     Resp 16     Temp 98.2 F (36.8 C)     Temp src      SpO2 100 %   No data found.  Updated Vital Signs BP 123/87   Pulse 84   Temp 98.2 F (36.8 C)   Resp 16   SpO2 100%    Physical Exam  Constitutional: She is oriented to person, place, and time. She appears well-developed and well-nourished. No distress.  HENT:    Head: Normocephalic and atraumatic.  Right Ear: External ear normal.  Left Ear: External ear normal.  Mouth/Throat: Oropharynx is clear and moist.  Eyes: Pupils are equal, round, and reactive to light. Conjunctivae are normal.  No nystagmus.  Disks flat.  Fundi benign.  Neck: Normal range of motion. No thyromegaly present.  Cardiovascular: Normal rate, regular rhythm and normal heart sounds.  Pulmonary/Chest: Effort normal and breath sounds normal. No respiratory distress.  Abdominal: Soft. She exhibits no distension.  Musculoskeletal: Normal range of motion. She exhibits no edema.  Lymphadenopathy:    She has no cervical adenopathy.  Neurological: She is alert and oriented to person, place, and time. She displays abnormal reflex. No cranial nerve deficit. Coordination normal.  Skin: Skin is warm and dry.  Psychiatric: She has a normal mood and affect. Her behavior is normal.     UC Treatments / Results  Labs (all labs ordered are listed, but only abnormal results are displayed) Labs Reviewed - No data to display  EKG None  Radiology No results found.  Procedures Procedures (including critical care time)  Medications Ordered in UC Medications - No data to display  Initial Impression / Assessment and Plan / UC Course  I have reviewed the triage vital signs and the nursing notes.  Pertinent labs & imaging results that were available during my care of the patient were reviewed by me and considered in my medical decision making (see chart for details).     Patient with vertigo.  Headache.  No focal neurologic findings.  I explained her that some people do have vertiginous migraines.  She does not have any ear findings on exam.  She wished to see an ENT.  I feel this is reasonable.  I told her if this fails she needs to go to neurology. Final Clinical Impressions(s) / UC Diagnoses   Final diagnoses:  Vertigo     Discharge Instructions     Call [facility] ear nose and  throat tomorrow at 9 AM to set up an appointment Drink plenty of fluids Take meclizine (Antivert) for the dizziness Return if worse instead of better at any time   ED Prescriptions    Medication Sig Dispense Auth. Provider   meclizine (ANTIVERT) 12.5 MG tablet Take 1 tablet (12.5 mg total) by mouth 3 (three) times daily as needed for dizziness. 30 tablet Eustace Moore, MD     Controlled Substance Prescriptions [place] Controlled Substance Registry consulted? Not Applicable   Eustace Moore, MD 06/18/18 [phone]

## 2018-06-18 NOTE — ED Triage Notes (Signed)
Pt states shes had dizziness off and on since July, states never been dx with vertigo but it feels like that, when looking up or down sometimes she feels dizzy. Also off and on migraines.

## 2018-07-05 ENCOUNTER — Other Ambulatory Visit: Payer: Self-pay | Admitting: Obstetrics and Gynecology

## 2018-07-05 DIAGNOSIS — Z1231 Encounter for screening mammogram for malignant neoplasm of breast: Secondary | ICD-10-CM

## 2018-09-18 ENCOUNTER — Other Ambulatory Visit (HOSPITAL_COMMUNITY): Payer: Self-pay | Admitting: Anesthesiology

## 2018-09-18 DIAGNOSIS — M79641 Pain in right hand: Secondary | ICD-10-CM

## 2018-09-19 ENCOUNTER — Other Ambulatory Visit (HOSPITAL_COMMUNITY): Payer: Self-pay | Admitting: Anesthesiology

## 2018-09-19 DIAGNOSIS — M79641 Pain in right hand: Secondary | ICD-10-CM

## 2018-09-20 ENCOUNTER — Ambulatory Visit (HOSPITAL_COMMUNITY)
Admission: RE | Admit: 2018-09-20 | Discharge: 2018-09-20 | Disposition: A | Discharge status: Home / Self Care | Payer: PRIVATE HEALTH INSURANCE | Source: Ambulatory Visit | Attending: Vascular Surgery | Admitting: Vascular Surgery

## 2018-09-20 DIAGNOSIS — M79641 Pain in right hand: Secondary | ICD-10-CM

## 2018-09-27 ENCOUNTER — Encounter (HOSPITAL_COMMUNITY)
Admission: RE | Admit: 2018-09-27 | Discharge: 2018-09-27 | Disposition: A | Discharge status: Home / Self Care | Payer: PRIVATE HEALTH INSURANCE | Source: Ambulatory Visit | Attending: Anesthesiology | Admitting: Anesthesiology

## 2018-09-27 ENCOUNTER — Other Ambulatory Visit (HOSPITAL_COMMUNITY): Payer: Self-pay | Admitting: Anesthesiology

## 2018-09-27 DIAGNOSIS — M79641 Pain in right hand: Secondary | ICD-10-CM | POA: Diagnosis not present

## 2020-04-22 ENCOUNTER — Other Ambulatory Visit: Payer: Self-pay

## 2020-04-22 ENCOUNTER — Encounter (HOSPITAL_COMMUNITY): Payer: Self-pay | Admitting: Emergency Medicine

## 2020-04-22 ENCOUNTER — Ambulatory Visit (HOSPITAL_COMMUNITY)
Admission: EM | Admit: 2020-04-22 | Discharge: 2020-04-22 | Disposition: A | Discharge status: Home / Self Care | Payer: No Typology Code available for payment source | Attending: Family Medicine | Admitting: Family Medicine

## 2020-04-22 DIAGNOSIS — J0100 Acute maxillary sinusitis, unspecified: Secondary | ICD-10-CM | POA: Diagnosis not present

## 2020-04-22 MED ORDER — AMOXICILLIN-POT CLAVULANATE 875-125 MG PO TABS: 1.0000 | ORAL_TABLET | Freq: Two times a day (BID) | ORAL | 0 refills | Status: AC

## 2020-04-22 NOTE — ED Triage Notes (Signed)
Pt sts URI sx with sinus congestion and sore throat; pt denies fever sts some cough; pt has had covid vaccines

## 2020-04-22 NOTE — Discharge Instructions (Signed)
Treating you for sinus infection.  You can continue the Nasonex.  Add in cetirizine daily. Take the antibiotic as prescribed Follow up as needed for continued or worsening symptoms

## 2020-04-23 NOTE — ED Provider Notes (Signed)
[facility]    CSN: [id number] Arrival date & time: 04/22/20  1006      History   Chief Complaint Chief Complaint  Patient presents with  . URI  . Nasal Congestion    HPI Stacey Esparza is a 49 y.o. female.   Patient is a 49 year old female with past medical history of seasonal asthma, headache.  She presents today with sinus congestion, sinus pressure, headache sore throat, cough.  Symptoms have been constant and worsening over the past 6 days.  Feels similar to previous sinus infections in the past.  Has been using over-the-counter medicines without any relief.  No fevers, chills, body aches or night sweats.  ROS per HPI      Past Medical History:  Diagnosis Date  . Asthma    seasonal - inhaler once per year, asked pt to bring with her day of surgery  . Headache(784.0)    seasonal - not migraines    There are no problems to display for this patient.   Past Surgical History:  Procedure Laterality Date  . BLADDER SUSPENSION  04/27/2011   Procedure: TRANSVAGINAL TAPE (TVT) PROCEDURE;  Surgeon: Lenoard Aden, MD;  Location: [facility];  Service: Gynecology;  Laterality: N/A;  . c-sections x 2. 2008, 2010    . CYSTOSCOPY  04/27/2011   Procedure: CYSTOSCOPY;  Surgeon: Lenoard Aden, MD;  Location: [facility];  Service: Gynecology;  Laterality: N/A;  Bladder  . LAPAROSCOPIC SUPRACERVICAL HYSTERECTOMY  2010   during c-section, known placenta previa    OB History   No obstetric history on file.      Home Medications    Prior to Admission medications   Medication Sig Start Date End Date Taking? Authorizing Provider  amoxicillin-clavulanate (AUGMENTIN) 875-125 MG tablet Take 1 tablet by mouth every 12 (twelve) hours. 04/22/20   Dahlia Byes A, NP  guaiFENesin (MUCINEX) 600 MG 12 hr tablet Take by mouth 2 (two) times daily.    [provider]  meclizine (ANTIVERT) 12.5 MG tablet Take 1 tablet (12.5 mg total) by mouth 3 (three) times daily as  needed for dizziness. 06/18/18   Eustace Moore, MD  mometasone (NASONEX) 50 MCG/ACT nasal spray Place 2 sprays into the nose daily.    [provider]    Family History Family History  Problem Relation Age of Onset  . Healthy Mother   . Healthy Father     Social History Social History   Tobacco Use  . Smoking status: Never Smoker  . Smokeless tobacco: Never Used  Substance Use Topics  . Alcohol use: Yes    Alcohol/week: 1.0 standard drink    Types: 1 Glasses of wine per week  . Drug use: No     Allergies   Patient has no known allergies.   Review of Systems Review of Systems   Physical Exam Triage Vital Signs ED Triage Vitals  Enc Vitals Group     BP 04/22/20 1107 126/86     Pulse Rate 04/22/20 1107 (!) 101     Resp 04/22/20 1107 18     Temp 04/22/20 1107 98.4 F (36.9 C)     Temp Source 04/22/20 1107 Oral     SpO2 04/22/20 1107 99 %     Weight --      Height --      Head Circumference --      Peak Flow --      Pain Score 04/22/20 1108 6  Pain Loc --      Pain Edu? --      Excl. in GC? --    No data found.  Updated Vital Signs BP 126/86 (BP Location: Right Arm)   Pulse (!) 101   Temp 98.4 F (36.9 C) (Oral)   Resp 18   SpO2 99%   Visual Acuity Right Eye Distance:   Left Eye Distance:   Bilateral Distance:    Right Eye Near:   Left Eye Near:    Bilateral Near:     Physical Exam Vitals and nursing note reviewed.  Constitutional:      General: She is not in acute distress.    Appearance: Normal appearance. She is not ill-appearing, toxic-appearing or diaphoretic.  HENT:     Head: Normocephalic.     Right Ear: Tympanic membrane and ear canal normal.     Left Ear: Tympanic membrane and ear canal normal.     Nose: Congestion present.     Right Sinus: Maxillary sinus tenderness and frontal sinus tenderness present.     Left Sinus: Maxillary sinus tenderness and frontal sinus tenderness present.  Eyes:     Conjunctiva/sclera:  Conjunctivae normal.  Cardiovascular:     Rate and Rhythm: Normal rate and regular rhythm.  Pulmonary:     Effort: Pulmonary effort is normal.     Breath sounds: Normal breath sounds.  Musculoskeletal:        General: Normal range of motion.     Cervical back: Normal range of motion.  Skin:    General: Skin is warm and dry.     Findings: No rash.  Neurological:     Mental Status: She is alert.  Psychiatric:        Mood and Affect: Mood normal.      UC Treatments / Results  Labs (all labs ordered are listed, but only abnormal results are displayed) Labs Reviewed - No data to display  EKG   Radiology No results found.  Procedures Procedures (including critical care time)  Medications Ordered in UC Medications - No data to display  Initial Impression / Assessment and Plan / UC Course  I have reviewed the triage vital signs and the nursing notes.  Pertinent labs & imaging results that were available during my care of the patient were reviewed by me and considered in my medical decision making (see chart for details).     Viral URI and sinusitis We will go ahead and cover based on history and worsening symptoms approaching 7 days. Augmentin to treat.  Cetirizine daily.  Recommended continue the Nasonex. Follow up as needed for continued or worsening symptoms  Final Clinical Impressions(s) / UC Diagnoses   Final diagnoses:  Acute non-recurrent maxillary sinusitis     Discharge Instructions     Treating you for sinus infection.  You can continue the Nasonex.  Add in cetirizine daily. Take the antibiotic as prescribed Follow up as needed for continued or worsening symptoms     ED Prescriptions    Medication Sig Dispense Auth. Provider   amoxicillin-clavulanate (AUGMENTIN) 875-125 MG tablet Take 1 tablet by mouth every 12 (twelve) hours. 14 tablet Georganne Siple A, NP     PDMP not reviewed this encounter.   Janace Aris, NP 04/23/20 1041

## 2020-06-18 LAB — COLOGUARD: COLOGUARD: NEGATIVE

## 2023-08-12 LAB — COLOGUARD: COLOGUARD: NEGATIVE
